# Patient Record
Sex: Male | Born: 1940 | Race: White | Hispanic: No | Marital: Married | Smoking: Never smoker
Health system: Southern US, Community
[De-identification: ages and names within clinical notes are randomized; demographics above are authoritative.]

## PROBLEM LIST (undated history)

## (undated) DIAGNOSIS — H269 Unspecified cataract: Secondary | ICD-10-CM

## (undated) DIAGNOSIS — M1990 Unspecified osteoarthritis, unspecified site: Secondary | ICD-10-CM

## (undated) DIAGNOSIS — I10 Essential (primary) hypertension: Secondary | ICD-10-CM

## (undated) DIAGNOSIS — Z87442 Personal history of urinary calculi: Secondary | ICD-10-CM

## (undated) DIAGNOSIS — Z972 Presence of dental prosthetic device (complete) (partial): Secondary | ICD-10-CM

## (undated) DIAGNOSIS — R519 Headache, unspecified: Secondary | ICD-10-CM

## (undated) DIAGNOSIS — M48061 Spinal stenosis, lumbar region without neurogenic claudication: Secondary | ICD-10-CM

## (undated) DIAGNOSIS — H9190 Unspecified hearing loss, unspecified ear: Secondary | ICD-10-CM

## (undated) DIAGNOSIS — R51 Headache: Secondary | ICD-10-CM

## (undated) HISTORY — PX: ABDOMINAL EXPOSURE: SHX5708

## (undated) HISTORY — PX: MULTIPLE TOOTH EXTRACTIONS: SHX2053

## (undated) HISTORY — PX: COLONOSCOPY: SHX174

## (undated) HISTORY — PX: HERNIA REPAIR: SHX51

## (undated) HISTORY — PX: FRACTURE SURGERY: SHX138

---

## 2016-03-15 ENCOUNTER — Telehealth (INDEPENDENT_AMBULATORY_CARE_PROVIDER_SITE_OTHER): Payer: Self-pay

## 2016-03-15 NOTE — Telephone Encounter (Signed)
If mostly low back without shooting leg and no new trauma since then 2016, then ok for two level facets but will eval and let him if I see him and it seems like it is different or needs more evaluation will talk then ie does not guarentee injection will be done.

## 2016-03-15 NOTE — Telephone Encounter (Signed)
Patient called requesting another injection. Had bilateral L4-5 & L5-S1 facet 11/10/14. Says it helped and pain has returned recently. Just back and no leg pain. Ok to repeat vs ov? Lives in [place].

## 2016-03-16 NOTE — Telephone Encounter (Signed)
Pt scheduled for 03/26/16 @2 :15 w/driver and no BTS

## 2016-03-26 ENCOUNTER — Encounter (INDEPENDENT_AMBULATORY_CARE_PROVIDER_SITE_OTHER): Payer: Self-pay | Admitting: Physical Medicine and Rehabilitation

## 2016-03-26 ENCOUNTER — Ambulatory Visit (INDEPENDENT_AMBULATORY_CARE_PROVIDER_SITE_OTHER): Payer: Self-pay

## 2016-03-26 ENCOUNTER — Ambulatory Visit (INDEPENDENT_AMBULATORY_CARE_PROVIDER_SITE_OTHER): Payer: Medicare Other | Admitting: Physical Medicine and Rehabilitation

## 2016-03-26 VITALS — BP 141/76 | HR 93 | Temp 98.4°F

## 2016-03-26 DIAGNOSIS — M5416 Radiculopathy, lumbar region: Secondary | ICD-10-CM

## 2016-03-26 MED ORDER — LIDOCAINE HCL (PF) 1 % IJ SOLN
0.3300 mL | Freq: Once | INTRAMUSCULAR | Status: AC
  Administered 2016-03-26: 0.3 mL

## 2016-03-26 MED ORDER — METHYLPREDNISOLONE ACETATE 80 MG/ML IJ SUSP
80.0000 mg | Freq: Once | INTRAMUSCULAR | Status: AC
  Administered 2016-03-26: 80 mg

## 2016-03-26 NOTE — Progress Notes (Signed)
Ian Hendricks - 76 y.o. male MRN [id number]  Date of birth: 1940/01/29  Office Visit Note:  Visit Date: 03/26/2016 PCP: No primary care provider on file. Referred by: No ref. provider found  Subjective: Chief Complaint  Patient presents with  . 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 From a clinical standpoint he has both facet mediated low back pain predominantly on the left side but he has some referral into the hip itself feel like is related to the stenosis. When we saw him in the past we completed an epidural injection from a transforaminal approach was very successful with the hip pain. We came back with a facet joint block gave him a lot of relief for his low back pain we really haven't seen him for over a year. He reports increasing similar symptoms once again without any known trauma no numbness or tingling or paresthesia no focal weakness and no red flag symptoms.Pain returned around 3 to 4 months and increased pain for around 1 month. Comes and goes. Says some days no pain at all. "No rhyme or reason".   left l4 tf esi  ROS Otherwise per HPI.  Assessment & Plan: Visit Diagnoses:  1. Lumbar radiculopathy     Plan: Findings:  Left L4 transforaminal epidural steroid injection was performed today due to the likely radicular component of his hip pain. He does have significant severe stenosis at this level. They do travel from [place] and we did bring him in for potential injection. This is done well in the past but this would also be diagnostic because it has been over a year and a half since we've seen him. If he does not get much relief and he will call us and we will try to make appropriate evaluation at that time. I do try to minimize his trips and out from where he lives. Physical exam findings are nonfocal.    Meds & Orders:  Meds ordered this  encounter  Medications  . lidocaine (PF) (XYLOCAINE) 1 % injection 0.3 mL  . methylPREDNISolone acetate (DEPO-MEDROL) injection 80 mg    Orders Placed This Encounter  Procedures  . XR C-ARM NO REPORT  . Epidural Steroid injection    Follow-up: Return if symptoms worsen or fail to improve, 2 weeks, for considering facet blocks.   Procedures: No procedures performed  Lumbosacral Transforaminal Epidural Steroid Injection - Infraneural Approach with Fluoroscopic Guidance  Patient: Ian Hendricks      Date of Birth: [birth year] MRN: [id number] PCP: No primary care provider on file.      Visit Date: 03/26/2016   Universal Protocol:    Date/Time: 03/21/185:59 AM  Consent Given By: the patient  Position: PRONE   Additional Comments: Vital signs were monitored before and after the procedure. Patient was prepped and draped in the usual sterile fashion. The correct patient, procedure, and site was verified.   Injection Procedure Details:  Procedure Site One Meds Administered:  Meds ordered this encounter  Medications  . lidocaine (PF) (XYLOCAINE) 1 % injection 0.3 mL  . methylPREDNISolone acetate (DEPO-MEDROL) injection 80 mg      Laterality: Left  Location/Site:  L4-L5  Needle size: 22 G  Needle type: Spinal  Needle Placement: Transforaminal  Findings:  -Contrast Used: 2 mL iohexol 180 mg iodine/mL   -Comments: Excellent flow of contrast along the nerve and into the epidural space.  Procedure Details: After squaring off the end-plates of the desired vertebral level to get a true AP view, the C-arm was obliqued to the painful side so that the superior articulating process is positioned about 1/3 the length of the inferior endplate.  The needle was aimed toward the junction of the superior articular process and the transverse process of the inferior vertebrae. The needle's initial entry is in the lower third of the foramen through Kambin's triangle. The soft tissues  overlying this target were infiltrated with 2-3 ml. of 1% Lidocaine without Epinephrine.  The spinal needle was then inserted and advanced toward the target using a "trajectory" view along the fluoroscope beam.  Under AP and lateral visualization, the needle was advanced so it did not puncture dura and did not traverse medially beyond the 6 o'clock position of the pedicle. Bi-planar projections were used to confirm position. Aspiration was confirmed to be negative for CSF and/or blood. A 1-2 ml. volume of Isovue-250 was injected and flow of contrast was noted at each level. Radiographs were obtained for documentation purposes.   After attaining the desired flow of contrast documented above, a 0.5 to 1.0 ml test dose of 0.25% Marcaine was injected into each respective transforaminal space.  The patient was observed for 90 seconds post injection.  After no sensory deficits were reported, and normal lower extremity motor function was noted,   the above injectate was administered so that equal amounts of the injectate were placed at each foramen (level) into the transforaminal epidural space.   Additional Comments:  The patient tolerated the procedure well Dressing: Band-Aid    Post-procedure details: Patient was observed during the procedure. Post-procedure instructions were reviewed.  Patient left the clinic in stable condition.   Clinical History: No specialty comments available.  He reports that he has never smoked. He has never used smokeless tobacco. No results for input(s): HGBA1C, LABURIC in the last 8760 hours.  Objective:  VS:  HT:    WT:   BMI:     BP:(!) 141/76  HR:93bpm  TEMP:98.4 F (36.9 C)( )  RESP:94 % Physical Exam  Musculoskeletal:  Patient is slow to rise from a seated position with concordant pain with extension rotation to the left more than right. He has no pain over the greater trochanters no pain with hip rotation. He has good distal strength without clonus.    Neurological: He exhibits normal muscle tone. Coordination normal.    Ortho Exam Imaging: No results found.  Past Medical/Family/Surgical/Social History: Medications & Allergies reviewed per EMR There are no active problems to display for this patient.  History reviewed. No pertinent past medical history. History reviewed. No pertinent family history. History reviewed. No pertinent surgical history. Social History   Occupational History  . Not on file.   Social History Main Topics  . Smoking status: Never Smoker  . Smokeless tobacco: Never Used  . Alcohol use No  . Drug use: No  . Sexual activity: Not on file

## 2016-03-26 NOTE — Patient Instructions (Signed)

## 2016-03-28 NOTE — Procedures (Signed)
Lumbosacral Transforaminal Epidural Steroid Injection - Infraneural Approach with Fluoroscopic Guidance  Patient: Ian Hendricks      Date of Birth: August 21, 1940 MRN: [id number] PCP: No primary care provider on file.      Visit Date: 03/26/2016   Universal Protocol:    Date/Time: 03/21/185:59 AM  Consent Given By: the patient  Position: PRONE   Additional Comments: Vital signs were monitored before and after the procedure. Patient was prepped and draped in the usual sterile fashion. The correct patient, procedure, and site was verified.   Injection Procedure Details:  Procedure Site One Meds Administered:  Meds ordered this encounter  Medications  . lidocaine (PF) (XYLOCAINE) 1 % injection 0.3 mL  . methylPREDNISolone acetate (DEPO-MEDROL) injection 80 mg      Laterality: Left  Location/Site:  L4-L5  Needle size: 22 G  Needle type: Spinal  Needle Placement: Transforaminal  Findings:  -Contrast Used: 2 mL iohexol 180 mg iodine/mL   -Comments: Excellent flow of contrast along the nerve and into the epidural space.  Procedure Details: After squaring off the end-plates of the desired vertebral level to get a true AP view, the C-arm was obliqued to the painful side so that the superior articulating process is positioned about 1/3 the length of the inferior endplate.  The needle was aimed toward the junction of the superior articular process and the transverse process of the inferior vertebrae. The needle's initial entry is in the lower third of the foramen through Kambin's triangle. The soft tissues overlying this target were infiltrated with 2-3 ml. of 1% Lidocaine without Epinephrine.  The spinal needle was then inserted and advanced toward the target using a "trajectory" view along the fluoroscope beam.  Under AP and lateral visualization, the needle was advanced so it did not puncture dura and did not traverse medially beyond the 6 o'clock position of the pedicle.  Bi-planar projections were used to confirm position. Aspiration was confirmed to be negative for CSF and/or blood. A 1-2 ml. volume of Isovue-250 was injected and flow of contrast was noted at each level. Radiographs were obtained for documentation purposes.   After attaining the desired flow of contrast documented above, a 0.5 to 1.0 ml test dose of 0.25% Marcaine was injected into each respective transforaminal space.  The patient was observed for 90 seconds post injection.  After no sensory deficits were reported, and normal lower extremity motor function was noted,   the above injectate was administered so that equal amounts of the injectate were placed at each foramen (level) into the transforaminal epidural space.   Additional Comments:  The patient tolerated the procedure well Dressing: Band-Aid    Post-procedure details: Patient was observed during the procedure. Post-procedure instructions were reviewed.  Patient left the clinic in stable condition.

## 2016-06-13 ENCOUNTER — Telehealth (INDEPENDENT_AMBULATORY_CARE_PROVIDER_SITE_OTHER): Payer: Self-pay | Admitting: Physical Medicine and Rehabilitation

## 2016-06-13 NOTE — Telephone Encounter (Signed)
In the past we did TF esi followed by facet joint block. If he is having mostly back pain then would facet block, see SRS

## 2016-06-13 NOTE — Telephone Encounter (Signed)
Depending on insurance we can play by ear, if needed precert then ok to repeat last

## 2016-06-13 NOTE — Telephone Encounter (Signed)
Called patient and left message for him to call back to discuss/ schedule. 

## 2016-06-13 NOTE — Telephone Encounter (Signed)
Patient said it is in his lower back, but sometimes it does hurt/ ache in his left hip and leg. He said March inj did help. Repeat?

## 2016-06-14 NOTE — Telephone Encounter (Signed)
Scheduled for 6/18 at 1500 with driver. Medicare and [employer].

## 2016-06-25 ENCOUNTER — Ambulatory Visit (INDEPENDENT_AMBULATORY_CARE_PROVIDER_SITE_OTHER): Payer: Medicare Other

## 2016-06-25 ENCOUNTER — Ambulatory Visit (INDEPENDENT_AMBULATORY_CARE_PROVIDER_SITE_OTHER): Payer: Medicare Other | Admitting: Physical Medicine and Rehabilitation

## 2016-06-25 VITALS — BP 128/70 | HR 58

## 2016-06-25 DIAGNOSIS — M5416 Radiculopathy, lumbar region: Secondary | ICD-10-CM

## 2016-06-25 DIAGNOSIS — M48062 Spinal stenosis, lumbar region with neurogenic claudication: Secondary | ICD-10-CM | POA: Diagnosis not present

## 2016-06-25 MED ORDER — METHYLPREDNISOLONE ACETATE 80 MG/ML IJ SUSP
80.0000 mg | Freq: Once | INTRAMUSCULAR | Status: AC
  Administered 2016-06-25: 80 mg

## 2016-06-25 MED ORDER — LIDOCAINE HCL (PF) 1 % IJ SOLN
2.0000 mL | Freq: Once | INTRAMUSCULAR | Status: AC
  Administered 2016-06-25: 2 mL

## 2016-06-25 NOTE — Progress Notes (Deleted)
Pain across low back. Right=left. Radiates down left leg to calf at times. No numbness or tingling.

## 2016-06-25 NOTE — Patient Instructions (Signed)

## 2016-06-26 ENCOUNTER — Encounter (INDEPENDENT_AMBULATORY_CARE_PROVIDER_SITE_OTHER): Payer: Self-pay | Admitting: Physical Medicine and Rehabilitation

## 2016-06-26 NOTE — Procedures (Signed)
Lumbosacral Transforaminal Epidural Steroid Injection - Infraneural Approach with Fluoroscopic Guidance  Patient: Ian Hendricks      Date of Birth: 1940/12/11 MRN: [id number] PCP: No primary care provider on file.      Visit Date: 06/25/2016   Universal Protocol:     Consent Given By: the patient  Position: PRONE   Additional Comments: Vital signs were monitored before and after the procedure. Patient was prepped and draped in the usual sterile fashion. The correct patient, procedure, and site was verified.   Injection Procedure Details:  Procedure Site One Meds Administered:  Meds ordered this encounter  Medications  . lidocaine (PF) (XYLOCAINE) 1 % injection 2 mL  . methylPREDNISolone acetate (DEPO-MEDROL) injection 80 mg      Laterality: Bilateral  Location/Site:  L4-L5  Needle size: 22 G  Needle type: Spinal  Needle Placement: Transforaminal  Findings:  -Contrast Used: 1 mL iohexol 180 mg iodine/mL   -Comments: Excellent flow of contrast along the nerve and into the epidural space.  Procedure Details: After squaring off the end-plates of the desired vertebral level to get a true AP view, the C-arm was obliqued to the painful side so that the superior articulating process is positioned about 1/3 the length of the inferior endplate.  The needle was aimed toward the junction of the superior articular process and the transverse process of the inferior vertebrae. The needle's initial entry is in the lower third of the foramen through Kambin's triangle. The soft tissues overlying this target were infiltrated with 2-3 ml. of 1% Lidocaine without Epinephrine.  The spinal needle was then inserted and advanced toward the target using a "trajectory" view along the fluoroscope beam.  Under AP and lateral visualization, the needle was advanced so it did not puncture dura and did not traverse medially beyond the 6 o'clock position of the pedicle. Bi-planar projections were used  to confirm position. Aspiration was confirmed to be negative for CSF and/or blood. A 1-2 ml. volume of Isovue-250 was injected and flow of contrast was noted at each level. Radiographs were obtained for documentation purposes.   After attaining the desired flow of contrast documented above, a 0.5 to 1.0 ml test dose of 0.25% Marcaine was injected into each respective transforaminal space.  The patient was observed for 90 seconds post injection.  After no sensory deficits were reported, and normal lower extremity motor function was noted,   the above injectate was administered so that equal amounts of the injectate were placed at each foramen (level) into the transforaminal epidural space.   Additional Comments:  The patient tolerated the procedure well No complications occurred Dressing: Band-Aid    Post-procedure details: Patient was observed during the procedure. Post-procedure instructions were reviewed.  Patient left the clinic in stable condition.

## 2016-06-26 NOTE — Progress Notes (Signed)
Ian Hendricks - 76 y.o. male MRN [id number]  Date of birth: Jan 04, 1941  Office Visit Note: Visit Date: 06/25/2016 PCP: No primary care provider on file. Referred by: No ref. provider found  Subjective: Chief Complaint  Patient presents with  . Lower Back - Pain   HPI: Ian Hendricks is a 76 year old gentleman that we seen on a few occasions. When I first saw him we completed transforaminal epidural steroid injection for good relief of his radicular pain and claudication. He went on for quite some time and we ended up repeating the injection and he did well again. At that point we did complete facet joint blocks and he got some relief of his back pain at that point and was doing well. The last time I saw him was in March of this year and he had similar complaints except more left-sided hip and leg pain versus bilateral leg pain. He has had an MRI from 2016 this is reviewed again below. There is moderate to severe multifactorial stenosis at L4-5. He has multilevel facet arthropathy. He is still an active individual and 75. He does live in [place]. He's had no focal weakness but does have difficulty walking at times. He is better at rest. He's had no trauma. No prior lumbar surgery. He has had therapy and chiropractic care in the remote past but not recently. He does not take a lot of medications. He is not on any blood thinners. He reports last injection which was in L4 transforaminal injection seemed to help quite a bit but the symptoms returned pretty quickly. He does get more symptoms on the left hip and leg more than right    Review of Systems  Constitutional: Negative for chills, fever, malaise/fatigue and weight loss.  HENT: Negative for hearing loss and sinus pain.   Eyes: Negative for blurred vision, double vision and photophobia.  Respiratory: Negative for cough and shortness of breath.   Cardiovascular: Negative for chest pain, palpitations and leg swelling.    Gastrointestinal: Negative for abdominal pain, nausea and vomiting.  Genitourinary: Negative for flank pain.  Musculoskeletal: Positive for back pain. Negative for myalgias.       Bilateral hip and leg pain  Skin: Negative for itching and rash.  Neurological: Negative for tremors, focal weakness and weakness.  Endo/Heme/Allergies: Negative.   Psychiatric/Behavioral: Negative for depression.  All other systems reviewed and are negative.  Otherwise per HPI.  Assessment & Plan: Visit Diagnoses:  1. 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 He also has left radicular leg pain with standing and ambulating. Prior injection at L4 gave him quite a bit relief on the left side but his symptoms returned. It did seem to help the back pain as well. In the past she's had facet joint blocks which were somewhat beneficial but those were sometime ago. MRI evidence of significant stenosis at L4-5. He may do well with simple decompression laminectomy. We did talk at length about surgery and the typical outcomes for surgery and what can be done. We are going to get a referral and appointment to Dr. Otelia SergeantNitka for evaluation. We are going to repeat the L4 transforaminal injection today but do this bilaterally. Hopefully we'll get a lot of relief from that as well. He has no red flag symptoms on exam or clinical history.  He is felt conservative care otherwise. He still an active 76 year old individual. I spent more than 25 minutes speaking face-to-face with the patient with 50% of the time in counseling.    Meds & Orders:  Meds ordered this encounter  Medications  . lidocaine (PF) (XYLOCAINE) 1 % injection 2 mL  . methylPREDNISolone acetate (DEPO-MEDROL) injection 80 mg    Orders Placed This Encounter  Procedures  . XR C-ARM NO  REPORT  . Epidural Steroid injection    Follow-up: Return if symptoms worsen or fail to improve, for referral to Dr. Otelia Sergeant.   Procedures: No procedures performed  Lumbosacral Transforaminal Epidural Steroid Injection - Infraneural Approach with Fluoroscopic Guidance  Patient: Ian Hendricks      Date of Birth: [birth year] MRN: [id number] PCP: No primary care provider on file.      Visit Date: 06/25/2016   Universal Protocol:     Consent Given By: the patient  Position: PRONE   Additional Comments: Vital signs were monitored before and after the procedure. Patient was prepped and draped in the usual sterile fashion. The correct patient, procedure, and site was verified.   Injection Procedure Details:  Procedure Site One Meds Administered:  Meds ordered this encounter  Medications  . lidocaine (PF) (XYLOCAINE) 1 % injection 2 mL  . methylPREDNISolone acetate (DEPO-MEDROL) injection 80 mg      Laterality: Bilateral  Location/Site:  L4-L5  Needle size: 22 G  Needle type: Spinal  Needle Placement: Transforaminal  Findings:  -Contrast Used: 1 mL iohexol 180 mg iodine/mL   -Comments: Excellent flow of contrast along the nerve and into the epidural space.  Procedure Details: After squaring off the end-plates of the desired vertebral level to get a true AP view, the C-arm was obliqued to the painful side so that the superior articulating process is positioned about 1/3 the length of the inferior endplate.  The needle was aimed toward the junction of the superior articular process and the transverse process of the inferior vertebrae. The needle's initial entry is in the lower third of the foramen through Kambin's triangle. The soft tissues overlying this target were infiltrated with 2-3 ml. of 1% Lidocaine without Epinephrine.  The spinal needle was then inserted and advanced toward the target using a "trajectory" view along the fluoroscope beam.  Under AP and lateral  visualization, the needle was advanced so it did not puncture dura and did not traverse medially beyond the 6 o'clock position of the pedicle. Bi-planar projections were used to confirm position. Aspiration was confirmed to be negative for CSF and/or blood. A 1-2 ml. volume of Isovue-250 was injected and flow of contrast was noted at each level. Radiographs were obtained for documentation purposes.   After attaining the desired flow of contrast documented above, a 0.5 to 1.0 ml test dose of 0.25% Marcaine was injected into each respective transforaminal space.  The patient was observed for 90 seconds post injection.  After no sensory deficits were reported, and normal lower extremity motor function was noted,   the above injectate was administered so that equal amounts of the injectate were placed at each foramen (level) into the transforaminal epidural space.   Additional Comments:  The patient tolerated the procedure well No complications occurred Dressing: Band-Aid    Post-procedure details: Patient was observed during the procedure. Post-procedure instructions were reviewed.  Patient left the clinic in stable condition.   Clinical History: MRI lumbar spine dated 10/05/2014 Moderate facet arthropathy at L3-4 with right L3 foraminal  disc protrusion. Moderate to severe facet arthropathy at L4-5 with moderate to severe stenosis centrally and lateral recesses. L5-S1 facet moderate arthritis.  He reports that he has never smoked. He has never used smokeless tobacco. No results for input(s): HGBA1C, LABURIC in the last 8760 hours.  Objective:  VS:  HT:    WT:   BMI:     BP:128/70  HR:(!) 58bpm  TEMP: ( )  RESP:95 % Physical Exam  Constitutional: He is oriented to person, place, and time. He appears well-developed and well-nourished. No distress.  HENT:  Head: Normocephalic and atraumatic.  Eyes: Conjunctivae are normal. Pupils are equal, round, and reactive to light.  Neck: Normal range  of motion. Neck supple.  Cardiovascular: Regular rhythm and intact distal pulses.   Pulmonary/Chest: Effort normal. No respiratory distress.  Musculoskeletal:  Patient ambulates without aid with a forward flexed spine. He has pain with extension of the lumbar spine. No pain over the greater trochanters. No pain with hip rotation. Good distal strength. No clonus bilaterally.  Neurological: He is alert and oriented to person, place, and time. He exhibits normal muscle tone. Coordination normal.  Skin: Skin is warm and dry. No rash noted. No erythema.  Psychiatric: He has a normal mood and affect.  Nursing note and vitals reviewed.   Ortho Exam Imaging: Xr C-arm No Report  Result Date: 06/25/2016 Please see Notes or Procedures tab for imaging impression.   Past Medical/Family/Surgical/Social History: Medications & Allergies reviewed per EMR There are no active problems to display for this patient.  History reviewed. No pertinent past medical history. History reviewed. No pertinent family history. History reviewed. No pertinent surgical history. Social History   Occupational History  . Not on file.   Social History Main Topics  . Smoking status: Never Smoker  . Smokeless tobacco: Never Used  . Alcohol use No  . Drug use: No  . Sexual activity: Not on file

## 2016-08-09 ENCOUNTER — Ambulatory Visit (INDEPENDENT_AMBULATORY_CARE_PROVIDER_SITE_OTHER): Payer: Medicare Other

## 2016-08-09 ENCOUNTER — Encounter (INDEPENDENT_AMBULATORY_CARE_PROVIDER_SITE_OTHER): Payer: Self-pay | Admitting: Specialist

## 2016-08-09 ENCOUNTER — Ambulatory Visit (INDEPENDENT_AMBULATORY_CARE_PROVIDER_SITE_OTHER): Payer: Medicare Other | Admitting: Specialist

## 2016-08-09 DIAGNOSIS — M5416 Radiculopathy, lumbar region: Secondary | ICD-10-CM

## 2016-08-09 DIAGNOSIS — M48062 Spinal stenosis, lumbar region with neurogenic claudication: Secondary | ICD-10-CM | POA: Diagnosis not present

## 2016-08-09 DIAGNOSIS — G9519 Other vascular myelopathies: Secondary | ICD-10-CM

## 2016-08-09 NOTE — Progress Notes (Signed)
Office Visit Note   Patient: Ian Hendricks           Date of Birth: 10/14/1940           MRN: [id number] Visit Date: 08/09/2016              Requested by: No referring provider defined for this encounter. PCP: System, Pcp Not In   Assessment & Plan: Visit Diagnoses:  1. Radiculopathy, lumbar region   2. Neurogenic claudication     Plan: Due to patient's ongoing symptoms that failed conservative treatment we'll schedule lumbar spine MRI to rule out HNP/stenosis. Compare to previous study done 2016. Follow-up the office with Dr.Nitka after to discuss results and further treatment options.  Follow-Up Instructions: No Follow-up on file.   Orders:  Orders Placed This Encounter  Procedures  . XR Lumbar Spine 2-3 Views  . MR LUMBAR SPINE WO CONTRAST   No orders of the defined types were placed in this encounter.     Procedures: No procedures performed   Clinical Data: No additional findings.   Subjective: Chief Complaint  Patient presents with  . Lower Back - Pain    HPI 76 year old white male is being seen at request of Dr. Alvester MorinNewton for ongoing issues with lumbar spine. The last several years patient having complaints of low back pain with radiation into his left buttock and thigh. Nothing radiating below the knee. Occasional numbness and tingling into the left thighs well. No symptoms on the right side. Patient states that symptoms are worse with standing and ambulation. States that when he is in the store he does have to hold onto a grocery cart leaning forward due to the increased symptoms in his low back and left upper leg. He's had ESI with in the past with temporary improvement. Most recent injections were March in June 2018 left L4-5 transforaminal. MRI scan lumbar spine 10/05/2014 report read loss spinal stenosis at L1-2 and L2-3. Mild spinal stenosis T11-12. Moderate spinal stenosis L3-4 with superimposed right-sided disc protrusion, expected impingement of the  right L4 nerve root. Moderate to severe spinal stenosis at L5-S1.  Review of Systems  no current cardiac pulmonary GI GU issues.   Objective: Vital Signs: There were no vitals taken for this visit.  Physical Exam  Constitutional: He is oriented to person, place, and time. He appears well-developed. No distress.  HENT:  Head: Normocephalic and atraumatic.  Eyes: Pupils are equal, round, and reactive to light. EOM are normal.  Neck: Normal range of motion.  Pulmonary/Chest: No respiratory distress.  Abdominal: He exhibits no distension.  Musculoskeletal:  Positive left lumbar paraspinal tenderness/spasms. Sciatic notch nontender. Nontender over the bilateral hip greater trochanters. Negative logroll bilateral hips. Negative straight leg raise. Bilateral calves nontender. No focal motor deficits. Neurovascularly intact.  Neurological: He is alert and oriented to person, place, and time.  Skin: Skin is warm and dry.    Ortho Exam  Specialty Comments:  No specialty comments available.  Imaging: No results found.   PMFS History: There are no active problems to display for this patient.  No past medical history on file.  No family history on file.  No past surgical history on file. Social History   Occupational History  . Not on file.   Social History Main Topics  . Smoking status: Never Smoker  . Smokeless tobacco: Never Used  . Alcohol use No  . Drug use: No  . Sexual activity: Not on file

## 2016-08-15 ENCOUNTER — Telehealth (INDEPENDENT_AMBULATORY_CARE_PROVIDER_SITE_OTHER): Payer: Self-pay | Admitting: Specialist

## 2016-08-15 NOTE — Telephone Encounter (Signed)
Pt is scheduled and is aware of appt

## 2016-08-15 NOTE — Telephone Encounter (Signed)
Patient called asking to set up [place] at the [place]. CB # [phone]

## 2016-08-30 ENCOUNTER — Encounter (INDEPENDENT_AMBULATORY_CARE_PROVIDER_SITE_OTHER): Payer: Self-pay | Admitting: Specialist

## 2016-08-30 ENCOUNTER — Ambulatory Visit (INDEPENDENT_AMBULATORY_CARE_PROVIDER_SITE_OTHER): Payer: Medicare Other | Admitting: Specialist

## 2016-08-30 VITALS — BP 166/80 | HR 80 | Temp 97.5°F | Ht 69.0 in | Wt 180.0 lb

## 2016-08-30 DIAGNOSIS — M48062 Spinal stenosis, lumbar region with neurogenic claudication: Secondary | ICD-10-CM

## 2016-08-30 NOTE — Progress Notes (Addendum)
Office Visit Note   Patient: Ian Hendricks           Date of Birth: 1940-07-22           MRN: [id number] Visit Date: 08/30/2016              Requested by: No referring provider defined for this encounter. PCP: System, Pcp Not In   Assessment & Plan: Visit Diagnoses:  1. Spinal stenosis of lumbar region with neurogenic claudication     Plan: Avoid bending, stooping and avoid lifting weights greater than 10 lbs. Avoid prolong standing and walking. Surgery scheduling secretary Tivis Ringer, will call you in the next week to schedule for surgery.  Surgery recommended is a three level lumbar bilateral partial hemilaminectomies for spinal stenosis L3-4, L4-5 and L5-S1  Take hydrocodone for for pain. Risk of surgery includes risk of infection 1 in 200 patients, bleeding 1/2% chance you would need a transfusion.   Risk to the nerves is one in 10,000. You will need to use a corset half days for 2-3 weeks to allow for lumbar support. Expect improved walking and standing tolerance. Expect relief of leg pain but numbness may persist depending on the length and degree of pressure that has been present.  Follow-Up Instructions: Return in about 4 weeks (around 09/27/2016).   Orders:  No orders of the defined types were placed in this encounter.  No orders of the defined types were placed in this encounter.     Procedures: No procedures performed   Clinical Data: No additional findings.   Subjective: Chief Complaint  Patient presents with  . Lower Back - Follow-up    76 year old male with several month history of left leg pain with numbness and weakness. Pain is improved with sitting and flexion of the left hip. Pain is worse with standing and walking any distance. He is relieved with sitting for a short period and then can walk again. No bowel or bladder difficulty. He is leaning on carts at the grocery store and avoiding going to a grocery store at all due to pain in his  buttocks and thigh the longer he is standing and walking. No pain with coughing. No difficuly reaching his shoes or socks. Esis have been done with      Review of Systems  Constitutional: Negative.   HENT: Negative.   Eyes: Negative.   Respiratory: Negative.   Cardiovascular: Negative.   Gastrointestinal: Negative.   Endocrine: Negative.   Genitourinary: Negative.   Musculoskeletal: Negative.   Skin: Negative.   Allergic/Immunologic: Negative.   Neurological: Negative.   Hematological: Negative.   Psychiatric/Behavioral: Negative.      Objective: Vital Signs: BP (!) 166/80 (BP Location: Left Arm, Patient Position: Sitting, Cuff Size: Normal)   Pulse 80   Temp (!) 97.5 F (36.4 C) (Oral)   Ht 5\' 9"  (1.753 m)   Wt 180 lb (81.6 kg)   BMI 26.58 kg/m   Physical Exam  Constitutional: He is oriented to person, place, and time. He appears well-developed and well-nourished.  HENT:  Head: Normocephalic and atraumatic.  Eyes: Pupils are equal, round, and reactive to light. EOM are normal.  Neck: Normal range of motion. Neck supple.  Pulmonary/Chest: Effort normal and breath sounds normal.  Abdominal: Soft. Bowel sounds are normal.  Neurological: He is alert and oriented to person, place, and time.  Skin: Skin is warm and dry.  Psychiatric: He has a normal mood and affect. His behavior is  normal. Judgment and thought content normal.    Back Exam   Tenderness  The patient is experiencing tenderness in the lumbar.  Range of Motion  Extension: abnormal  Flexion: normal  Lateral Bend Right: abnormal  Lateral Bend Left: abnormal  Rotation Right: abnormal  Rotation Left: abnormal   Muscle Strength  Right Quadriceps:  5/5  Left Quadriceps:  5/5  Right Hamstrings:  5/5  Left Hamstrings:  5/5   Tests  Straight leg raise right: negative Straight leg raise left: negative  Reflexes  Patellar: Hyperreflexic Achilles: normal Babinski's sign: normal   Other  Toe Walk:  normal Heel Walk: normal      Specialty Comments:  No specialty comments available.  Imaging: No results found.   PMFS History: There are no active problems to display for this patient.  History reviewed. No pertinent past medical history.  History reviewed. No pertinent family history.  History reviewed. No pertinent surgical history. Social History   Occupational History  . Not on file.   Social History Main Topics  . Smoking status: Never Smoker  . Smokeless tobacco: Never Used  . Alcohol use No  . Drug use: No  . Sexual activity: Not on file

## 2016-08-30 NOTE — Patient Instructions (Signed)
Avoid bending, stooping and avoid lifting weights greater than 10 lbs. Avoid prolong standing and walking. Surgery scheduling secretary Tivis Ringer, will call you in the next week to schedule for surgery.  Surgery recommended is a three level lumbar bilateral partial hemilaminectomies for spinal stenosis L3-4, L4-5 and L5-S1  Take hydrocodone for for pain. Risk of surgery includes risk of infection 1 in 200 patients, bleeding 1/2% chance you would need a transfusion.   Risk to the nerves is one in 10,000. You will need to use a corset half days for 2-3 weeks to allow for lumbar support. Expect improved walking and standing tolerance. Expect relief of leg pain but numbness may persist depending on the length and degree of pressure that has been present.

## 2016-10-15 ENCOUNTER — Telehealth (INDEPENDENT_AMBULATORY_CARE_PROVIDER_SITE_OTHER): Payer: Self-pay

## 2016-10-15 NOTE — Telephone Encounter (Signed)
Patient drove down from [place], [place] to see about scheduling surgery.  I never received an order to schedule.  I advised patient I would obtain order from Dr. Otelia Sergeant and we would get patient scheduled as soon as possible.

## 2016-10-25 ENCOUNTER — Encounter (HOSPITAL_COMMUNITY): Payer: Self-pay

## 2016-10-25 ENCOUNTER — Encounter (HOSPITAL_COMMUNITY)
Admission: RE | Admit: 2016-10-25 | Discharge: 2016-10-25 | Disposition: A | Discharge status: Home / Self Care | Payer: Medicare Other | Source: Ambulatory Visit | Attending: Specialist | Admitting: Specialist

## 2016-10-25 ENCOUNTER — Ambulatory Visit (HOSPITAL_COMMUNITY)
Admission: RE | Admit: 2016-10-25 | Discharge: 2016-10-25 | Disposition: A | Discharge status: Home / Self Care | Payer: Medicare Other | Source: Ambulatory Visit | Attending: Surgery | Admitting: Surgery

## 2016-10-25 DIAGNOSIS — I700 Atherosclerosis of aorta: Secondary | ICD-10-CM | POA: Insufficient documentation

## 2016-10-25 DIAGNOSIS — Z7982 Long term (current) use of aspirin: Secondary | ICD-10-CM | POA: Insufficient documentation

## 2016-10-25 DIAGNOSIS — Z79899 Other long term (current) drug therapy: Secondary | ICD-10-CM | POA: Diagnosis not present

## 2016-10-25 DIAGNOSIS — R001 Bradycardia, unspecified: Secondary | ICD-10-CM | POA: Insufficient documentation

## 2016-10-25 DIAGNOSIS — M48061 Spinal stenosis, lumbar region without neurogenic claudication: Secondary | ICD-10-CM | POA: Diagnosis not present

## 2016-10-25 DIAGNOSIS — I440 Atrioventricular block, first degree: Secondary | ICD-10-CM | POA: Insufficient documentation

## 2016-10-25 DIAGNOSIS — Z01810 Encounter for preprocedural cardiovascular examination: Secondary | ICD-10-CM | POA: Insufficient documentation

## 2016-10-25 DIAGNOSIS — Z01818 Encounter for other preprocedural examination: Secondary | ICD-10-CM | POA: Insufficient documentation

## 2016-10-25 DIAGNOSIS — J984 Other disorders of lung: Secondary | ICD-10-CM | POA: Diagnosis not present

## 2016-10-25 HISTORY — DX: Spinal stenosis, lumbar region without neurogenic claudication: M48.061

## 2016-10-25 HISTORY — DX: Unspecified hearing loss, unspecified ear: H91.90

## 2016-10-25 HISTORY — DX: Unspecified cataract: H26.9

## 2016-10-25 HISTORY — DX: Headache: R51

## 2016-10-25 HISTORY — DX: Personal history of urinary calculi: Z87.442

## 2016-10-25 HISTORY — DX: Presence of dental prosthetic device (complete) (partial): Z97.2

## 2016-10-25 HISTORY — DX: Unspecified osteoarthritis, unspecified site: M19.90

## 2016-10-25 HISTORY — DX: Headache, unspecified: R51.9

## 2016-10-25 HISTORY — DX: Essential (primary) hypertension: I10

## 2016-10-25 LAB — CBC
HCT: 48.8 % (ref 39.0–52.0)
Hemoglobin: 16.2 g/dL (ref 13.0–17.0)
MCH: 30.1 pg (ref 26.0–34.0)
MCHC: 33.2 g/dL (ref 30.0–36.0)
MCV: 90.7 fL (ref 78.0–100.0)
PLATELETS: 189 10*3/uL (ref 150–400)
RBC: 5.38 MIL/uL (ref 4.22–5.81)
RDW: 12.9 % (ref 11.5–15.5)
WBC: 7.4 10*3/uL (ref 4.0–10.5)

## 2016-10-25 LAB — COMPREHENSIVE METABOLIC PANEL
ALK PHOS: 48 U/L (ref 38–126)
ALT: 20 U/L (ref 17–63)
AST: 24 U/L (ref 15–41)
Albumin: 4.4 g/dL (ref 3.5–5.0)
Anion gap: 10 (ref 5–15)
BILIRUBIN TOTAL: 1.3 mg/dL — AB (ref 0.3–1.2)
BUN: 12 mg/dL (ref 6–20)
CALCIUM: 9.6 mg/dL (ref 8.9–10.3)
CHLORIDE: 101 mmol/L (ref 101–111)
CO2: 27 mmol/L (ref 22–32)
CREATININE: 0.93 mg/dL (ref 0.61–1.24)
Glucose, Bld: 127 mg/dL — ABNORMAL HIGH (ref 65–99)
Potassium: 3.5 mmol/L (ref 3.5–5.1)
Sodium: 138 mmol/L (ref 135–145)
TOTAL PROTEIN: 7.5 g/dL (ref 6.5–8.1)

## 2016-10-25 LAB — SURGICAL PCR SCREEN
MRSA, PCR: NEGATIVE
Staphylococcus aureus: POSITIVE — AB

## 2016-10-25 LAB — PROTIME-INR
INR: 1
PROTHROMBIN TIME: 13.1 s (ref 11.4–15.2)

## 2016-10-25 LAB — APTT: aPTT: 27 seconds (ref 24–36)

## 2016-10-25 NOTE — Progress Notes (Signed)
Pt denies SOB, chest pain, and being under the care of a cardiologist. Pt denies having an echo and cardiac cath but stated that a stress test was performed > 10 years ago. Pt denies having a chest x ray and EKG within the last year. Pt denies recent labs. Spoke with Sherrie, Surgical Coordinator, to make MD aware that UA needs to be recollected on DOS. Anesthesia asked to review EKG and chest x ray.

## 2016-10-25 NOTE — Pre-Procedure Instructions (Signed)
    Suan HalterDouglas E Heindel  10/25/2016      [facility] - [place], [place] - [address] [place] [id number] Phone: [phone] Fax: [phone]    Your procedure is scheduled on Tuesday, October 30, 2016  Report to [facility] Ad[facility] at 5:30 A.M.  Call this number if you have problems the morning of surgery:  [phone]   Remember:  Do not eat food or drink liquids after midnight Monday, October 29, 2016  Take these medicines the morning of surgery with A SIP OF WATER :amLODipine (NORVASC),  bisoprolol-hydrochlorothiazide [facility](ZIAC)  Stop taking Aspirin, all vitamins, fish oil, Prostate with Palmetto and herbal medications. Do not take any NSAIDs ie: Ibuprofen, Advil, Naproxen Aleve), Motrin, Meloxicam (MOBIC), BC and Goody Powder  or any medication containing Aspirin; stop now.  Do not wear jewelry, make-up or nail polish.  Do not wear lotions, powders, or perfumes, or deoderant.  Do not shave 48 hours prior to surgery.  Men may shave face and neck.  Do not bring valuables to the hospital.  [facility] is not responsible for any belongings or valuables.  Contacts, dentures or bridgework may not be worn into surgery.  Leave your suitcase in the car.  After surgery it may be brought to your room. For patients admitted to the hospital, discharge time will be determined by your treatment team. Patients discharged the day of surgery will not be allowed to drive home.  Special instructions: Shower the night before surgery and the morning of surgery with CHG. Please read over the following fact sheets that you were given. Pain Booklet, Coughing and Deep Breathing, MRSA Information and Surgical Site Infection Prevention

## 2016-10-30 ENCOUNTER — Inpatient Hospital Stay (HOSPITAL_COMMUNITY)
Admission: RE | Admit: 2016-10-30 | Discharge: 2016-11-01 | DRG: 517 | Disposition: A | Discharge status: Home Health | Payer: Medicare Other | Source: Ambulatory Visit | Attending: Specialist | Admitting: Specialist

## 2016-10-30 ENCOUNTER — Inpatient Hospital Stay (HOSPITAL_COMMUNITY): Payer: Medicare Other

## 2016-10-30 ENCOUNTER — Encounter (HOSPITAL_COMMUNITY)
Admission: RE | Disposition: A | Discharge status: Home Health | Payer: Self-pay | Source: Ambulatory Visit | Attending: Specialist

## 2016-10-30 ENCOUNTER — Inpatient Hospital Stay (HOSPITAL_COMMUNITY): Payer: Medicare Other | Admitting: Emergency Medicine

## 2016-10-30 ENCOUNTER — Encounter (HOSPITAL_COMMUNITY): Payer: Self-pay

## 2016-10-30 ENCOUNTER — Inpatient Hospital Stay (HOSPITAL_COMMUNITY): Payer: Medicare Other | Admitting: Certified Registered Nurse Anesthetist

## 2016-10-30 DIAGNOSIS — M48062 Spinal stenosis, lumbar region with neurogenic claudication: Secondary | ICD-10-CM | POA: Diagnosis present

## 2016-10-30 DIAGNOSIS — H9190 Unspecified hearing loss, unspecified ear: Secondary | ICD-10-CM | POA: Diagnosis present

## 2016-10-30 DIAGNOSIS — I10 Essential (primary) hypertension: Secondary | ICD-10-CM | POA: Diagnosis present

## 2016-10-30 DIAGNOSIS — Z7982 Long term (current) use of aspirin: Secondary | ICD-10-CM

## 2016-10-30 DIAGNOSIS — M48061 Spinal stenosis, lumbar region without neurogenic claudication: Secondary | ICD-10-CM

## 2016-10-30 DIAGNOSIS — M545 Low back pain: Secondary | ICD-10-CM | POA: Diagnosis present

## 2016-10-30 DIAGNOSIS — Z419 Encounter for procedure for purposes other than remedying health state, unspecified: Secondary | ICD-10-CM

## 2016-10-30 HISTORY — PX: LUMBAR LAMINECTOMY: SHX95

## 2016-10-30 LAB — URINALYSIS, ROUTINE W REFLEX MICROSCOPIC
BACTERIA UA: NONE SEEN
Bilirubin Urine: NEGATIVE
GLUCOSE, UA: NEGATIVE mg/dL
Ketones, ur: NEGATIVE mg/dL
Leukocytes, UA: NEGATIVE
Nitrite: NEGATIVE
PROTEIN: NEGATIVE mg/dL
SPECIFIC GRAVITY, URINE: 1.018 (ref 1.005–1.030)
Squamous Epithelial / LPF: NONE SEEN
pH: 5 (ref 5.0–8.0)

## 2016-10-30 SURGERY — MICRODISCECTOMY LUMBAR LAMINECTOMY
Anesthesia: General | Site: Back

## 2016-10-30 MED ORDER — BUPIVACAINE HCL (PF) 0.5 % IJ SOLN
INTRAMUSCULAR | Status: AC
  Filled 2016-10-30: qty 30

## 2016-10-30 MED ORDER — SODIUM CHLORIDE 0.9% FLUSH
3.0000 mL | Freq: Two times a day (BID) | INTRAVENOUS | Status: DC
  Administered 2016-10-30 – 2016-10-31 (×4): 3 mL via INTRAVENOUS

## 2016-10-30 MED ORDER — HYDROMORPHONE HCL 1 MG/ML IJ SOLN: 0.2500 mg | INTRAMUSCULAR | Status: DC | PRN

## 2016-10-30 MED ORDER — DOCUSATE SODIUM 100 MG PO CAPS
100.0000 mg | ORAL_CAPSULE | Freq: Two times a day (BID) | ORAL | Status: DC
  Administered 2016-10-30 – 2016-11-01 (×5): 100 mg via ORAL
  Filled 2016-10-30 (×5): qty 1

## 2016-10-30 MED ORDER — POLYETHYLENE GLYCOL 3350 17 G PO PACK: 17.0000 g | PACK | Freq: Every day | ORAL | Status: DC | PRN

## 2016-10-30 MED ORDER — ALBUMIN HUMAN 5 % IV SOLN
INTRAVENOUS | Status: DC | PRN
  Administered 2016-10-30: 09:00:00 via INTRAVENOUS

## 2016-10-30 MED ORDER — ROCURONIUM BROMIDE 10 MG/ML (PF) SYRINGE
PREFILLED_SYRINGE | INTRAVENOUS | Status: AC
  Filled 2016-10-30: qty 5

## 2016-10-30 MED ORDER — LIDOCAINE 2% (20 MG/ML) 5 ML SYRINGE
INTRAMUSCULAR | Status: DC | PRN
  Administered 2016-10-30: 100 mg via INTRAVENOUS

## 2016-10-30 MED ORDER — MIDAZOLAM HCL 2 MG/2ML IJ SOLN
INTRAMUSCULAR | Status: AC
  Filled 2016-10-30: qty 2

## 2016-10-30 MED ORDER — HYDROCODONE-ACETAMINOPHEN 5-325 MG PO TABS
1.0000 | ORAL_TABLET | ORAL | Status: DC | PRN
  Administered 2016-10-30 – 2016-10-31 (×3): 1 via ORAL
  Filled 2016-10-30 (×2): qty 1

## 2016-10-30 MED ORDER — METHOCARBAMOL 500 MG PO TABS
ORAL_TABLET | ORAL | Status: AC
  Filled 2016-10-30: qty 1

## 2016-10-30 MED ORDER — PHENYLEPHRINE HCL 10 MG/ML IJ SOLN
INTRAVENOUS | Status: DC | PRN
  Administered 2016-10-30: 20 ug/min via INTRAVENOUS

## 2016-10-30 MED ORDER — MELOXICAM 7.5 MG PO TABS
15.0000 mg | ORAL_TABLET | Freq: Every day | ORAL | Status: DC
  Administered 2016-10-31 – 2016-11-01 (×2): 15 mg via ORAL
  Filled 2016-10-30 (×2): qty 2

## 2016-10-30 MED ORDER — ONDANSETRON HCL 4 MG PO TABS: 4.0000 mg | ORAL_TABLET | Freq: Four times a day (QID) | ORAL | Status: DC | PRN

## 2016-10-30 MED ORDER — ONDANSETRON HCL 4 MG/2ML IJ SOLN
INTRAMUSCULAR | Status: DC | PRN
  Administered 2016-10-30: 4 mg via INTRAVENOUS

## 2016-10-30 MED ORDER — EPHEDRINE 5 MG/ML INJ
INTRAVENOUS | Status: AC
  Filled 2016-10-30: qty 10

## 2016-10-30 MED ORDER — FENTANYL CITRATE (PF) 250 MCG/5ML IJ SOLN
INTRAMUSCULAR | Status: AC
  Filled 2016-10-30: qty 5

## 2016-10-30 MED ORDER — MEPERIDINE HCL 25 MG/ML IJ SOLN: 6.2500 mg | INTRAMUSCULAR | Status: DC | PRN

## 2016-10-30 MED ORDER — MENTHOL 3 MG MT LOZG: 1.0000 | LOZENGE | OROMUCOSAL | Status: DC | PRN

## 2016-10-30 MED ORDER — MORPHINE SULFATE (PF) 2 MG/ML IV SOLN: 1.0000 mg | INTRAVENOUS | Status: DC | PRN

## 2016-10-30 MED ORDER — VITAMIN C 500 MG PO TABS
500.0000 mg | ORAL_TABLET | Freq: Every day | ORAL | Status: DC
  Administered 2016-10-31: 500 mg via ORAL
  Filled 2016-10-30: qty 1

## 2016-10-30 MED ORDER — FLEET ENEMA 7-19 GM/118ML RE ENEM: 1.0000 | ENEMA | Freq: Once | RECTAL | Status: DC | PRN

## 2016-10-30 MED ORDER — SUGAMMADEX SODIUM 200 MG/2ML IV SOLN
INTRAVENOUS | Status: DC | PRN
  Administered 2016-10-30: 200 mg via INTRAVENOUS

## 2016-10-30 MED ORDER — PHENOL 1.4 % MT LIQD: 1.0000 | OROMUCOSAL | Status: DC | PRN

## 2016-10-30 MED ORDER — FENTANYL CITRATE (PF) 100 MCG/2ML IJ SOLN
INTRAMUSCULAR | Status: DC | PRN
  Administered 2016-10-30: 50 ug via INTRAVENOUS
  Administered 2016-10-30: 150 ug via INTRAVENOUS

## 2016-10-30 MED ORDER — CEFAZOLIN SODIUM-DEXTROSE 2-4 GM/100ML-% IV SOLN
2.0000 g | Freq: Three times a day (TID) | INTRAVENOUS | Status: AC
  Administered 2016-10-30 – 2016-10-31 (×2): 2 g via INTRAVENOUS
  Filled 2016-10-30 (×2): qty 100

## 2016-10-30 MED ORDER — HYDROCODONE-ACETAMINOPHEN 5-325 MG PO TABS
ORAL_TABLET | ORAL | Status: AC
  Filled 2016-10-30: qty 1

## 2016-10-30 MED ORDER — ACETAMINOPHEN 325 MG PO TABS: 650.0000 mg | ORAL_TABLET | ORAL | Status: DC | PRN

## 2016-10-30 MED ORDER — CHLORHEXIDINE GLUCONATE 4 % EX LIQD: 60.0000 mL | Freq: Once | CUTANEOUS | Status: DC

## 2016-10-30 MED ORDER — ACETAMINOPHEN 650 MG RE SUPP: 650.0000 mg | RECTAL | Status: DC | PRN

## 2016-10-30 MED ORDER — GABAPENTIN 300 MG PO CAPS
300.0000 mg | ORAL_CAPSULE | Freq: Three times a day (TID) | ORAL | Status: DC
  Administered 2016-10-30 – 2016-11-01 (×6): 300 mg via ORAL
  Filled 2016-10-30 (×6): qty 1

## 2016-10-30 MED ORDER — EPHEDRINE SULFATE 50 MG/ML IJ SOLN
INTRAMUSCULAR | Status: DC | PRN
  Administered 2016-10-30: 10 mg via INTRAVENOUS
  Administered 2016-10-30: 5 mg via INTRAVENOUS
  Administered 2016-10-30: 10 mg via INTRAVENOUS
  Administered 2016-10-30: 5 mg via INTRAVENOUS

## 2016-10-30 MED ORDER — ADULT MULTIVITAMIN W/MINERALS CH
1.0000 | ORAL_TABLET | Freq: Every day | ORAL | Status: DC
  Administered 2016-10-31 – 2016-11-01 (×2): 1 via ORAL
  Filled 2016-10-30 (×3): qty 1

## 2016-10-30 MED ORDER — ASPIRIN EC 81 MG PO TBEC
81.0000 mg | DELAYED_RELEASE_TABLET | Freq: Every day | ORAL | Status: DC
  Administered 2016-10-31 – 2016-11-01 (×2): 81 mg via ORAL
  Filled 2016-10-30 (×2): qty 1

## 2016-10-30 MED ORDER — BUPIVACAINE HCL 0.5 % IJ SOLN
INTRAMUSCULAR | Status: DC | PRN
  Administered 2016-10-30: 5 mL
  Administered 2016-10-30: 15 mL

## 2016-10-30 MED ORDER — PROPOFOL 10 MG/ML IV BOLUS
INTRAVENOUS | Status: DC | PRN
Start: 2016-10-30 — End: 2016-10-30
  Administered 2016-10-30: 100 mg via INTRAVENOUS

## 2016-10-30 MED ORDER — LISINOPRIL 10 MG PO TABS
10.0000 mg | ORAL_TABLET | Freq: Every day | ORAL | Status: DC
  Administered 2016-10-31 – 2016-11-01 (×2): 10 mg via ORAL
  Filled 2016-10-30 (×2): qty 1

## 2016-10-30 MED ORDER — SUGAMMADEX SODIUM 200 MG/2ML IV SOLN
INTRAVENOUS | Status: AC
  Filled 2016-10-30: qty 2

## 2016-10-30 MED ORDER — BISOPROLOL-HYDROCHLOROTHIAZIDE 2.5-6.25 MG PO TABS
1.0000 | ORAL_TABLET | Freq: Every day | ORAL | Status: DC
  Administered 2016-10-31 – 2016-11-01 (×2): 1 via ORAL
  Filled 2016-10-30 (×2): qty 1

## 2016-10-30 MED ORDER — THROMBIN (RECOMBINANT) 20000 UNITS EX SOLR
OROMUCOSAL | Status: DC | PRN
  Administered 2016-10-30: 20 mL via TOPICAL

## 2016-10-30 MED ORDER — THROMBIN (RECOMBINANT) 20000 UNITS EX SOLR
CUTANEOUS | Status: AC
  Filled 2016-10-30: qty 20000

## 2016-10-30 MED ORDER — ONDANSETRON HCL 4 MG/2ML IJ SOLN: 4.0000 mg | Freq: Once | INTRAMUSCULAR | Status: DC | PRN

## 2016-10-30 MED ORDER — DEXAMETHASONE SODIUM PHOSPHATE 10 MG/ML IJ SOLN
INTRAMUSCULAR | Status: DC | PRN
  Administered 2016-10-30: 10 mg via INTRAVENOUS

## 2016-10-30 MED ORDER — HYDROCODONE-ACETAMINOPHEN 7.5-325 MG PO TABS
2.0000 | ORAL_TABLET | ORAL | Status: DC | PRN
  Administered 2016-10-30 – 2016-10-31 (×3): 2 via ORAL
  Filled 2016-10-30 (×3): qty 2

## 2016-10-30 MED ORDER — PROPOFOL 10 MG/ML IV BOLUS
INTRAVENOUS | Status: AC
  Filled 2016-10-30: qty 40

## 2016-10-30 MED ORDER — LIDOCAINE 2% (20 MG/ML) 5 ML SYRINGE
INTRAMUSCULAR | Status: AC
  Filled 2016-10-30: qty 5

## 2016-10-30 MED ORDER — CEFAZOLIN SODIUM-DEXTROSE 2-4 GM/100ML-% IV SOLN
2.0000 g | INTRAVENOUS | Status: AC
  Administered 2016-10-30: 2 g via INTRAVENOUS

## 2016-10-30 MED ORDER — BISACODYL 5 MG PO TBEC: 5.0000 mg | DELAYED_RELEASE_TABLET | Freq: Every day | ORAL | Status: DC | PRN

## 2016-10-30 MED ORDER — METHOCARBAMOL 500 MG PO TABS
500.0000 mg | ORAL_TABLET | Freq: Four times a day (QID) | ORAL | Status: DC | PRN
  Administered 2016-10-30: 500 mg via ORAL
  Filled 2016-10-30: qty 1

## 2016-10-30 MED ORDER — SODIUM CHLORIDE 0.9 % IV SOLN: 250.0000 mL | INTRAVENOUS | Status: DC

## 2016-10-30 MED ORDER — SODIUM CHLORIDE 0.9 % IV SOLN
INTRAVENOUS | Status: DC
  Administered 2016-10-30: 75 mL/h via INTRAVENOUS
  Administered 2016-10-30 – 2016-10-31 (×3): via INTRAVENOUS

## 2016-10-30 MED ORDER — METHOCARBAMOL 1000 MG/10ML IJ SOLN
500.0000 mg | Freq: Four times a day (QID) | INTRAVENOUS | Status: DC | PRN
  Filled 2016-10-30: qty 5

## 2016-10-30 MED ORDER — CEFAZOLIN SODIUM-DEXTROSE 2-4 GM/100ML-% IV SOLN
INTRAVENOUS | Status: AC
  Filled 2016-10-30: qty 100

## 2016-10-30 MED ORDER — ROCURONIUM BROMIDE 10 MG/ML (PF) SYRINGE
PREFILLED_SYRINGE | INTRAVENOUS | Status: DC | PRN
  Administered 2016-10-30: 25 mg via INTRAVENOUS
  Administered 2016-10-30: 50 mg via INTRAVENOUS
  Administered 2016-10-30: 25 mg via INTRAVENOUS

## 2016-10-30 MED ORDER — 0.9 % SODIUM CHLORIDE (POUR BTL) OPTIME
TOPICAL | Status: DC | PRN
  Administered 2016-10-30: 1000 mL

## 2016-10-30 MED ORDER — LACTATED RINGERS IV SOLN
INTRAVENOUS | Status: DC | PRN
  Administered 2016-10-30 (×2): via INTRAVENOUS

## 2016-10-30 MED ORDER — BUPIVACAINE LIPOSOME 1.3 % IJ SUSP
20.0000 mL | INTRAMUSCULAR | Status: AC
  Administered 2016-10-30: 10 mL
  Filled 2016-10-30: qty 20

## 2016-10-30 MED ORDER — ONDANSETRON HCL 4 MG/2ML IJ SOLN
INTRAMUSCULAR | Status: AC
  Filled 2016-10-30: qty 2

## 2016-10-30 MED ORDER — SODIUM CHLORIDE 0.9% FLUSH: 3.0000 mL | INTRAVENOUS | Status: DC | PRN

## 2016-10-30 MED ORDER — MIDAZOLAM HCL 5 MG/5ML IJ SOLN
INTRAMUSCULAR | Status: DC | PRN
  Administered 2016-10-30: 2 mg via INTRAVENOUS

## 2016-10-30 MED ORDER — ONDANSETRON HCL 4 MG/2ML IJ SOLN: 4.0000 mg | Freq: Four times a day (QID) | INTRAMUSCULAR | Status: DC | PRN

## 2016-10-30 MED ORDER — AMLODIPINE BESYLATE 5 MG PO TABS
5.0000 mg | ORAL_TABLET | Freq: Every day | ORAL | Status: DC
  Administered 2016-10-31 – 2016-11-01 (×2): 5 mg via ORAL
  Filled 2016-10-30 (×2): qty 1

## 2016-10-30 MED ORDER — DEXAMETHASONE SODIUM PHOSPHATE 10 MG/ML IJ SOLN
INTRAMUSCULAR | Status: AC
  Filled 2016-10-30: qty 1

## 2016-10-30 MED ORDER — ALUM & MAG HYDROXIDE-SIMETH 200-200-20 MG/5ML PO SUSP: 30.0000 mL | Freq: Four times a day (QID) | ORAL | Status: DC | PRN

## 2016-10-30 SURGICAL SUPPLY — 52 items
BENZOIN TINCTURE PRP APPL 2/3 (GAUZE/BANDAGES/DRESSINGS) ×3 IMPLANT
BUR SABER RD CUTTING 3.0 (BURR) ×2 IMPLANT
BUR SABER RD CUTTING 3.0MM (BURR) ×1
CANISTER SUCT 3000ML PPV (MISCELLANEOUS) ×3 IMPLANT
CLOSURE WOUND 1/2 X4 (GAUZE/BANDAGES/DRESSINGS) ×1
COVER MAYO STAND STRL (DRAPES) ×3 IMPLANT
COVER SURGICAL LIGHT HANDLE (MISCELLANEOUS) ×3 IMPLANT
DRAPE C-ARM 42X72 X-RAY (DRAPES) IMPLANT
DRAPE HALF SHEET 40X57 (DRAPES) IMPLANT
DRAPE MICROSCOPE LEICA (MISCELLANEOUS) ×3 IMPLANT
DRAPE SURG 17X23 STRL (DRAPES) ×12 IMPLANT
DRSG MEPILEX BORDER 4X4 (GAUZE/BANDAGES/DRESSINGS) IMPLANT
DRSG MEPILEX BORDER 4X8 (GAUZE/BANDAGES/DRESSINGS) ×3 IMPLANT
DURAPREP 26ML APPLICATOR (WOUND CARE) ×3 IMPLANT
ELECT BLADE 4.0 EZ CLEAN MEGAD (MISCELLANEOUS) ×3
ELECT CAUTERY BLADE 6.4 (BLADE) ×3 IMPLANT
ELECT REM PT RETURN 9FT ADLT (ELECTROSURGICAL) ×3
ELECTRODE BLDE 4.0 EZ CLN MEGD (MISCELLANEOUS) ×1 IMPLANT
ELECTRODE REM PT RTRN 9FT ADLT (ELECTROSURGICAL) ×1 IMPLANT
GLOVE BIOGEL PI IND STRL 8 (GLOVE) ×1 IMPLANT
GLOVE BIOGEL PI INDICATOR 8 (GLOVE) ×2
GLOVE ECLIPSE 8.5 STRL (GLOVE) ×3 IMPLANT
GLOVE ORTHO TXT STRL SZ7.5 (GLOVE) ×3 IMPLANT
GLOVE SURG 8.5 LATEX PF (GLOVE) ×3 IMPLANT
GOWN STRL REUS W/ TWL LRG LVL3 (GOWN DISPOSABLE) ×1 IMPLANT
GOWN STRL REUS W/TWL 2XL LVL3 (GOWN DISPOSABLE) ×6 IMPLANT
GOWN STRL REUS W/TWL LRG LVL3 (GOWN DISPOSABLE) ×2
KIT BASIN OR (CUSTOM PROCEDURE TRAY) ×3 IMPLANT
KIT ROOM TURNOVER OR (KITS) ×3 IMPLANT
NEEDLE SPNL 18GX3.5 QUINCKE PK (NEEDLE) ×6 IMPLANT
NS IRRIG 1000ML POUR BTL (IV SOLUTION) ×3 IMPLANT
PACK LAMINECTOMY ORTHO (CUSTOM PROCEDURE TRAY) ×3 IMPLANT
PAD ARMBOARD 7.5X6 YLW CONV (MISCELLANEOUS) ×6 IMPLANT
PATTIES SURGICAL .5 X.5 (GAUZE/BANDAGES/DRESSINGS) IMPLANT
PATTIES SURGICAL .75X.75 (GAUZE/BANDAGES/DRESSINGS) IMPLANT
PATTIES SURGICAL 1X1 (DISPOSABLE) ×3 IMPLANT
SPONGE LAP 4X18 X RAY DECT (DISPOSABLE) IMPLANT
SPONGE SURGIFOAM ABS GEL 100 (HEMOSTASIS) ×3 IMPLANT
STRIP CLOSURE SKIN 1/2X4 (GAUZE/BANDAGES/DRESSINGS) ×2 IMPLANT
SUT VIC AB 0 CT1 18XCR BRD8 (SUTURE) ×1 IMPLANT
SUT VIC AB 0 CT1 8-18 (SUTURE) ×2
SUT VIC AB 1 CTX 36 (SUTURE) ×2
SUT VIC AB 1 CTX36XBRD ANBCTR (SUTURE) ×1 IMPLANT
SUT VIC AB 2-0 CT1 27 (SUTURE) ×2
SUT VIC AB 2-0 CT1 TAPERPNT 27 (SUTURE) ×1 IMPLANT
SUT VIC AB 3-0 X1 27 (SUTURE) ×3 IMPLANT
SUT VICRYL 0 UR6 27IN ABS (SUTURE) ×3 IMPLANT
SYR 20CC LL (SYRINGE) ×3 IMPLANT
SYR CONTROL 10ML LL (SYRINGE) ×3 IMPLANT
TOWEL OR 17X24 6PK STRL BLUE (TOWEL DISPOSABLE) ×3 IMPLANT
TOWEL OR 17X26 10 PK STRL BLUE (TOWEL DISPOSABLE) ×3 IMPLANT
WATER STERILE IRR 1000ML POUR (IV SOLUTION) ×3 IMPLANT

## 2016-10-30 NOTE — Discharge Instructions (Signed)
No lifting greater than 10 lbs. Avoid bending, stooping and twisting. Walk in house for first week them may start to get out slowly increasing distance up to one mile by 3 weeks post op. Keep incision dry for 3 days, may use tegaderm or similar water impervious dressing.   Laminectomy, Care After This sheet gives you information about how to care for yourself after your procedure. Your health care provider may also give you more specific instructions. If you have problems or questions, contact your health care provider. What can I expect after the procedure? After the procedure, it is common to have:  Some pain around your incision area.  Muscle tightening (spasms) across the back.  Follow these instructions at home: Incision care  Follow instructions from your health care provider about how to take care of your incision area. Make sure you: ? Wash your hands with soap and water before and after you apply medicine to the area or change your bandage (dressing). If soap and water are not available, use hand sanitizer. ? Change your dressing as told by your health care provider. ? Leave stitches (sutures), skin glue, or adhesive strips in place. These skin closures may need to stay in place for 2 weeks or longer. If adhesive strip edges start to loosen and curl up, you may trim the loose edges. Do not remove adhesive strips completely unless your health care provider tells you to do that.  Check your incision area every day for signs of infection. Check for: ? More redness, swelling, or pain. ? More fluid or blood. ? Warmth. ? Pus or a bad smell. Medicines  Take over-the-counter and prescription medicines only as told by your health care provider.  If you were prescribed an antibiotic medicine, use it as told by your health care provider. Do not stop using the antibiotic even if you start to feel better. Bathing  Do not take baths, swim, or use a hot tub for 2 weeks, or until  your incision has healed completely.  If your health care provider approves, you may take showers after your dressing has been removed. Activity  Return to your normal activities as told by your health care provider. Ask your health care provider what activities are safe for you.  Avoid bending or twisting at your waist. Always bend at your knees.  Do not sit for more than 20-30 minutes at a time. Lie down or walk between periods of sitting.  Do not lift anything that is heavier than 10 lb (4.5 kg) or the limit that your health care provider tells you, until he or she says that it is safe.  Do not drive for 2 weeks after your procedure or for as long as your health care provider tells you.  Do not drive or use heavy machinery while taking prescription pain medicine. General instructions  To prevent or treat constipation while you are taking prescription pain medicine, your health care provider may recommend that you: ? Drink enough fluid to keep your urine clear or pale yellow. ? Take over-the-counter or prescription medicines. ? Eat foods that are high in fiber, such as fresh fruits and vegetables, whole grains, and beans. ? Limit foods that are high in fat and processed sugars, such as fried and sweet foods.  Do breathing exercises as told.  Keep all follow-up visits as told by your health care provider. This is important. Contact a health care provider if:  You have more redness, swelling, or  pain around your incision area.  Your incision feels warm to the touch.  You are not able to return to activities or do exercises as told by your health care provider. Get help right away if:  You have: ? More fluid or blood coming from your incision area. ? Pus or a bad smell coming from your incision area. ? Chills or a fever. ? Episodes of dizziness or fainting while standing.  You develop a rash.  You develop shortness of breath or you have difficulty breathing.  You cannot  control when you urinate or have a bowel movement.  You become weak.  You are not able to use your legs. Summary  After the procedure, it is common to have some pain around your incision area. You may also have muscle tightening (spasms) across the back.  Follow instructions from your health care provider about how to care for your incision.  Do not lift anything that is heavier than 10 lb (4.5 kg) or the limit that your health care provider tells you, until he or she says that it is safe.  Contact your health care provider if you have more redness, swelling, or pain around your incision area or if your incision feels warm to the touch. These can be signs of infection. This information is not intended to replace advice given to you by your health care provider. Make sure you discuss any questions you have with your health care provider. Document Released: 07/14/2004 Document Revised: 08/11/2015 Document Reviewed: 06/12/2015 Elsevier Interactive Patient Education  2018 [employer]

## 2016-10-30 NOTE — Anesthesia Postprocedure Evaluation (Signed)
Anesthesia Post Note  Patient: Suan HalterDouglas E Salomone  Procedure(s) Performed: BILATERAL PARTIAL HEMILAMINECTOMIES Lumbar three-four, , Lumbar four-five  and Lumbar five-Sacrum one (N/A Back)     Patient location during evaluation: PACU Anesthesia Type: General Level of consciousness: awake and alert Pain management: pain level controlled Vital Signs Assessment: post-procedure vital signs reviewed and stable Respiratory status: spontaneous breathing, nonlabored ventilation, respiratory function stable and patient connected to nasal cannula oxygen Cardiovascular status: blood pressure returned to baseline and stable Postop Assessment: no apparent nausea or vomiting Anesthetic complications: no    Last Vitals:  Vitals:   10/30/16 1230 10/30/16 1305  BP: (!) 149/76 125/63  Pulse: (!) 50 75  Resp: 20 18  Temp: 36.4 C 36.8 C  SpO2: 95% 95%    Last Pain:  Vitals:   10/30/16 1308  TempSrc:   PainSc: 2                  Tashi Band DAVID

## 2016-10-30 NOTE — H&P (Signed)
Ian Hendricks is an 76 y.o. male.   Chief Complaint: back pain and leg pain HPI: patient with hx of L3-S1 stenosis and above complaint presents for surgical intervention.  Progressively worsening symptoms.  Failed conservative treatment.   Past Medical History:  Diagnosis Date  . Arthritis   . Early cataracts, bilateral   . Headache    PMH  . History of kidney stones   . HOH (hard of hearing)   . Hypertension   . Lumbar stenosis   . Wears dentures     Past Surgical History:  Procedure Laterality Date  . ABDOMINAL EXPOSURE     to remove a kidney stone  . COLONOSCOPY    . FRACTURE SURGERY     right ankle  . HERNIA REPAIR     B/L inguinal  . MULTIPLE TOOTH EXTRACTIONS      History reviewed. No pertinent family history. Social History:  reports that he has never smoked. He has never used smokeless tobacco. He reports that he does not drink alcohol or use drugs.  Allergies: No Known Allergies  Medications Prior to Admission  Medication Sig Dispense Refill  . amLODipine (NORVASC) 5 MG tablet Take 5 mg by mouth daily.     Marland Kitchen. aspirin EC 81 MG tablet Take 81 mg by mouth daily.    . bisoprolol-hydrochlorothiazide (ZIAC) 2.5-6.25 MG tablet Take 1 tablet by mouth daily.    . Calcium Carbonate (CALCIUM 600 PO) Take 1 tablet by mouth daily.    Marland Kitchen. ibuprofen (ADVIL,MOTRIN) 200 MG tablet Take 200-600 mg by mouth every 6 (six) hours as needed for mild pain.    Marland Kitchen. lisinopril (PRINIVIL,ZESTRIL) 10 MG tablet Take 10 mg by mouth daily.    . meloxicam (MOBIC) 15 MG tablet Take 15 mg by mouth daily.    . Misc Natural Products (COMPLETE PROSTATE HEALTH PO) Take 1 tablet by mouth daily.    . Multiple Vitamin (MULTI-VITAMIN PO) Take 1 tablet by mouth daily.    . vitamin C (ASCORBIC ACID) 500 MG tablet Take 500 mg by mouth daily.      Results for orders placed or performed during the hospital encounter of 10/30/16 (from the past 48 hour(s))  Urinalysis, Routine w reflex microscopic     Status:  Abnormal   Collection Time: 10/30/16  5:36 AM  Result Value Ref Range   Color, Urine YELLOW YELLOW   APPearance CLEAR CLEAR   Specific Gravity, Urine 1.018 1.005 - 1.030   pH 5.0 5.0 - 8.0   Glucose, UA NEGATIVE NEGATIVE mg/dL   Hgb urine dipstick SMALL (A) NEGATIVE   Bilirubin Urine NEGATIVE NEGATIVE   Ketones, ur NEGATIVE NEGATIVE mg/dL   Protein, ur NEGATIVE NEGATIVE mg/dL   Nitrite NEGATIVE NEGATIVE   Leukocytes, UA NEGATIVE NEGATIVE   RBC / HPF 0-5 0 - 5 RBC/hpf   WBC, UA 0-5 0 - 5 WBC/hpf   Bacteria, UA NONE SEEN NONE SEEN   Squamous Epithelial / LPF NONE SEEN NONE SEEN   Mucus PRESENT    No results found.  Review of Systems  Constitutional: Negative.   HENT: Negative.   Respiratory: Negative.   Cardiovascular: Negative.   Gastrointestinal: Negative.   Genitourinary: Negative.   Musculoskeletal: Positive for back pain.  Skin: Negative.   Neurological: Positive for tingling.  Psychiatric/Behavioral: Negative.     Blood pressure (!) 169/74, pulse 63, temperature 98.7 F (37.1 C), temperature source Oral, resp. rate 19, height 5\' 9"  (1.753 m), weight 185 lb  1.6 oz (84 kg), SpO2 95 %. Physical Exam  Constitutional: He is oriented to person, place, and time. No distress.  HENT:  Head: Normocephalic and atraumatic.  Eyes: Pupils are equal, round, and reactive to light. EOM are normal.  Neck: Normal range of motion.  Respiratory: No respiratory distress.  GI: He exhibits no distension.  Musculoskeletal: He exhibits tenderness.  Neurological: He is alert and oriented to person, place, and time.  Skin: Skin is warm and dry.  Psychiatric: He has a normal mood and affect.     Assessment/Plan L3-S1 stenosis  We will proceed with BILATERAL PARTIAL HEMILAMINECTOMIES L3-4, L4-5 and L5-S1 as scheduled.  Surgical procedure along with possible risks and complications discussed.   All questions answered and wishes to proceed.   Zonia Kief, PA-C 10/30/2016, 6:34 AM

## 2016-10-30 NOTE — Op Note (Signed)
10/30/2016  11:24 AM     PATIENT:  Ian Hendricks  76 y.o. male     MRN: [id number]     OPERATIVE REPORT     PRE-OPERATIVE DIAGNOSIS:  lumbar spinal stenosis L3-4, L4-5 and L5-S1     POST-OPERATIVE DIAGNOSIS:  lumbar spinal stenosis Lumbar three-four , Lumbar four-five and Lumbar five-Sacrum one     PROCEDURE:  Procedure(s):    BILATERAL PARTIAL HEMILAMINECTOMIES Lumbar three-four, , Lumbar four-five  and Lumbar five-Sacrum one       SURGEON:  Jessy Oto, MD        ASSISTANT:  Benjiman Core, PA-C  (Present throughout the entire procedure and necessary for completion of procedure in a timely manner)        ANESTHESIA:  General,supplemented with local marcaine 0.5% 1:1 exparel 1.3% total 30cc, Dr. Lillia Abed.       COMPLICATIONS:  None.        DRAINS: Foley to SD.     EBL: 300CC  PROCEDURE: The patient was met in the holding area, and the appropriate lumbar levels left L3-4,L4-5 and L5-S1 levels identified and marked with an "X" and my initials. I had discussion with the patient in the preop holding area regarding a change of consent form. Patient understands the rationale to perform multiple bilateral partial hemilaminectomies decompress the bilateral  L3-4, L4-5 and L5-S1 levels for lateral recess and foramenal stenosis.  The patient was then transported to OR and was placed under general anesthetic without difficulty. The patient received appropriate preoperative antibiotic prophylaxis 2 gm Ancef. Nursing staff inserted a Foley catheter under sterile conditions. The patient was then turned to a prone position using the Elizabeth spine frame. PAS. all pressure points well padded the arms at the side to 9090. Standard prep with DuraPrep solution draped in the usual manner from the lower dorsal spine the mid sacral segment. Iodine Vi-Drape was used and the old incision scar was marked. Time-out procedure was called and correct. Skin in the midline between L3 and L5 was then  infiltrated with local anesthesia, marcaine 1/2% 1:1 exparel 1.3% total 20 cc used. Incision was then made extending from L3-L5 through the skin and subcutaneous layers ellipsing the old incision down to the patient's lumbodorsal fascia and spinous processes. The incision then carried sharply lateral to the supraspinous ligament and then continuing the lateral aspect of the spinous processes of  L4, L5 and S1. Cobb elevator used to carefully elevate the paralumbar muscles off of the posterior elements using electrocautery carefully drilled bleeding and perform dissection of the muscle tissues of the preserving the facet capsule at the L3-4, L4-5 and L5-S1. Continuing the exposure out laterally to expose the lateral margin of the facet joint line at L2-3 L3-4, L4-5 and L5-S1. Incision was carried in the midline down to the S1 level area bleeders controlled using electrocautery monopolar electrocautery.   Cross table radiograph then used to identify a Kocher clamp place at the interspinous process space L4-5, the L4 spinous process then marked with an OR marking pen. Exposure bilateral posterior elements from L3-4 to L5-S1 and Boss McCoullough retractor was used for exposure. High-speed bur then used to remove the inferior portion of the left L4 and L5 lamina over 20% with removal of the inferior articular process of L5 side first then right side approximately 20% resected. High-speed bur then used to carefully expose screws insertion site of the ligamentum flavum to the ventral aspect of the lamina of L5. The  ligamentum flavum was then effected with fine tooth forceps and the ligamentum flavum which was thickened and redundant resected off the medial aspect of the facet at the left L5-S1 level. Foraminotomy performed a left L5 S1 level primarily over S1. Osteotome then used to resect the medial left L5-S1 facet of about 25% removing the ligamentum flavum insertion into this medial border of the superior  articular process of S1. Similarly this was performed on the right side resecting ligamentum flavum and found to be hypertrophic although left side also have the appearance of previous laminotomy for decompression purposes. Soft tissue removed over the posterior aspect of the left L5-S1 interlaminar region. Loupe magnification and headlight were used initially then the sterily draped OR microscduring this portion procedure. A ball tipped nerve hook could be passed out the left  L5 and S1 neuroforamen demonstrating their patency. High-speed bur then used to remove the inferior portion of the left L3 and L4 lamina over 20% with removal of the left inferior articular process of L3 and L4 first then right side approximately 20% resected. High-speed bur then used to carefully expose the insertion site of the ligamentum flavum to the ventral aspect of the lamina of L3 and L4 . The ligamentum flavum was then effected with finetooth forceps and the ligamentum flavum which was thickened and redundant resected off the medial aspect of the facet at the L3-4 and L4-5 levels. Foraminotomy performed a left L3-4 level primarily over L4. Osteotome then used to resect the medial L3-4 and L4-5 facets removing the ligamentum flavum insertion into this medial border of the superior articular process of L4,and L5. Similarly this was performed on the right side resecting ligamentum flavum and found to be hypertrophic although right side.  Carefully the ligamentum flavum was freed off of the bony surfaces the right L3-4 and L4-5 medial articular facets at about 15-20%. This was removed using 2 and 3 mm Kerrisons both L5, L4 and L3  nerve roots were well decompressed as were the foramen L5 nerve root identified and L5 nerve root decompressed. A ball tipped nerve hook could be passed out both L3, L4 and L5 neuroforamen demonstrating their patency.Each of the posterior disc levels was evaluated for disc protrusion and no soft disc herniation  noted at any of the levels.A large amount of hypertrophic ligmentum flavum was found impressing on the lateral recesses at L3-4,L4-5 and L5-S1 and narrowing the respective L3,L4, L5 and S1, neuroforamen.The bleeders were controlled using bipolar electrocautery thrombin-soaked Gelfoam were appropriate. Irrigation was carried out with copious amounts of saline solution this was done throughout the case.Hockey stick neuroprobe was used to probe the neuroforamen bilateral L3, L4, L5 and S1, these were determined to be well decompressed.Gelfoam was then removed spinal canal at each of the various laminotomy sites bilateral  L3-4, L4-5, and L5-S1 levels.The lumbodorsal musculature carefully exam debrided of any devitalized tissue following removal of Boss retractors were the bleeders were controlled using electrocautery and the area dorsal lumbar muscle were then approximated in the midline with interrupted #1 Vicryl sutures loose the dorsal fascia was reattached to the spinous process of L3 to S1 inferiorly this was done with #1 Vicryl sutures. Subcutaneous layers then approximated using interrupted 0 Vicryl sutures and 2-0 Vicryl sutures. Skin was closed with a running subcutaneous stitch of 4-0 Vicryl Dermabond was applied then MedPlex bandage. All instrument and sponge counts were correct. The patient was then returned to a supine position on her bed reactivated extubated and returned  to the recovery room in satisfactory condition.    Benjiman Core, PA-C performed the duties of assistant surgeon throughout this case he assisted using loupe magnification and OR microscope retracting delicate neural structures suctioning over the neural structures throughout the case bilaterally . He was present from the beginning of the case to the end of the case. Assisted in positioning the patient and  the arm and legs. He also participated in removal the patient from the operating table.    Basil Dess  10/30/2016, 11:24  AM

## 2016-10-30 NOTE — Anesthesia Preprocedure Evaluation (Signed)
Anesthesia Evaluation  Patient identified by MRN, date of birth, ID band Patient awake    Reviewed: Allergy & Precautions, NPO status , Patient's Chart, lab work & pertinent test results  Airway Mallampati: I  TM Distance: >3 FB Neck ROM: Full    Dental   Pulmonary    Pulmonary exam normal        Cardiovascular hypertension, Pt. on medications Normal cardiovascular exam     Neuro/Psych    GI/Hepatic   Endo/Other    Renal/GU      Musculoskeletal   Abdominal   Peds  Hematology   Anesthesia Other Findings   Reproductive/Obstetrics                             Anesthesia Physical Anesthesia Plan  ASA: II  Anesthesia Plan: General   Post-op Pain Management:    Induction: Intravenous  PONV Risk Score and Plan: 2 and Ondansetron, Dexamethasone and Treatment may vary due to age or medical condition  Airway Management Planned: Oral ETT  Additional Equipment:   Intra-op Plan:   Post-operative Plan: Extubation in OR  Informed Consent: I have reviewed the patients History and Physical, chart, labs and discussed the procedure including the risks, benefits and alternatives for the proposed anesthesia with the patient or authorized representative who has indicated his/her understanding and acceptance.     Plan Discussed with: CRNA and Surgeon  Anesthesia Plan Comments:         Anesthesia Quick Evaluation

## 2016-10-30 NOTE — Progress Notes (Signed)
Patient arrived to unit.  Alert, verbally pleasant.  No complaints of pain or discomfort.  Adhesive bandage to lower back, clean dry and intact.  Patient able to log roll side to side.  Demonstrated use of call light.

## 2016-10-30 NOTE — Brief Op Note (Signed)
10/30/2016  11:03 AM  PATIENT:  Ian Hendricks  76 y.o. male  PRE-OPERATIVE DIAGNOSIS:  lumbar spinal stenosis L3-4, L4-5 and L5-S1  POST-OPERATIVE DIAGNOSIS:  lumbar spinal stenosis Lumbar three-four , Lumbar four-five and Lumbar five-Sacrum one  PROCEDURE:  Procedure(s): BILATERAL PARTIAL HEMILAMINECTOMIES Lumbar three-four, , Lumbar four-five  and Lumbar five-Sacrum one (N/A)  SURGEON:  Surgeon(s) and Role:     Kerrin ChampagneNitka, Alyscia Carmon E, MD - Primary  PHYSICIAN ASSISTANT: Andee LinemanJames Owens,PA-C  ANESTHESIA:   local and general, Dr. Arta BruceKevin Ossey.  EBL:  300 mL   BLOOD ADMINISTERED:none  DRAINS: Urinary Catheter (Foley)   LOCAL MEDICATIONS USED:  MARCAINE 0.5% 1:1 EXPAREL 1.3% Amount: 30 ml  SPECIMEN:  No Specimen  DISPOSITION OF SPECIMEN:  N/A  COUNTS:  YES   COMPLICATIONS: None  TOURNIQUET:  * No tourniquets in log *  DICTATION: .Dragon Dictation  PLAN OF CARE: Admit to inpatient   PATIENT DISPOSITION:  PACU - hemodynamically stable.   Delay start of Pharmacological VTE agent (>24hrs) due to surgical blood loss or risk of bleeding: yes

## 2016-10-30 NOTE — Progress Notes (Signed)
Physical Therapy Evaluation Patient Details Name: Ian Hendricks MRN: [id number] DOB: [birth year] Today's Date: 10/30/2016   History of Present Illness  Pt is a 76 y.o. male with hx of L3-S1 stenosis. S/p bilateral partial hemilaminectomies at L3-S1.  Clinical Impression  Evaluated patient and educated him on surgical precautions, bed mobility, transfers, and car transfers. Demonstrates good prognosis and capacity for return to prior level of function. Further PT services are not recommended at this time. PT will sign off.    Follow Up Recommendations No PT follow up;Supervision - Intermittent    Equipment Recommendations  None recommended by PT    Recommendations for Other Services       Precautions / Restrictions Precautions Precautions: Back Precaution Booklet Issued: Yes (comment) (handout) Precaution Comments: reviewed surgical precautions and handout with patient Required Braces or Orthoses:  (Per MD - no brace required) Restrictions Weight Bearing Restrictions: No      Mobility  Bed Mobility Overal bed mobility: Needs Assistance Bed Mobility: Rolling;Sidelying to Sit Rolling: Supervision Sidelying to sit: Supervision       General bed mobility comments: no physical assist required; cues for log roll technique. Pt utilized bed rails and able to transition to EOB  Transfers Overall transfer level: Needs assistance Equipment used: None Transfers: Sit to/from Stand Sit to Stand: Supervision         General transfer comment: min cues to scoot forward and have optimal LE position for stand; no physical assit required  Ambulation/Gait Ambulation/Gait assistance: Modified independent (Device/Increase time) Ambulation Distance (Feet): 500 Feet Assistive device: None Gait Pattern/deviations: Step-through pattern;WFL(Within Functional Limits)   Gait velocity interpretation: at or above normal speed for age/gender General Gait Details: steady pacing, no  instability or LOB noted. Pt did not demonstrate significant deficits while ambulating. Reports that he's walking the best he has in months without pain after surgery.  Stairs Stairs: Yes Stairs assistance: Supervision Stair Management: One rail Right;Alternating pattern Number of Stairs: 5 General stair comments: supervision for safety and management of lines; no physical assit required  Wheelchair Mobility    Modified Rankin (Stroke Patients Only)       Balance Overall balance assessment: Needs assistance Sitting-balance support: Feet supported;No upper extremity supported Sitting balance-Leahy Scale: Normal Sitting balance - Comments: pt able to maintain sitting EOB; and able to weight shift to advance LEs foward   Standing balance support: During functional activity;No upper extremity supported Standing balance-Leahy Scale: Good Standing balance comment: did not significantly challenge static standing; no instability or LOB noted             High level balance activites: Backward walking;Direction changes;Head turns;Turns;Sudden stops High Level Balance Comments: no instability or LOB noted with these challenges.             Pertinent Vitals/Pain Pain Assessment: No/denies pain    Home Living Family/patient expects to be discharged to:: Private residence Living Arrangements: Spouse/significant other Available Help at Discharge: Family;Available 24 hours/day Type of Home: House Home Access: Stairs to enter Entrance Stairs-Rails: None Entrance Stairs-Number of Steps: 1 Home Layout: One level Home Equipment: Cane - single point;Walker - 2 wheels;Crutches      Prior Function Level of Independence: Independent         Comments: pt reports that he has several assistive devices on standby just in case but doesn't need them for mobility. He drives and enjoys being active.      Hand Dominance        Extremity/Trunk Assessment  Upper Extremity  Assessment Upper Extremity Assessment: Overall WFL for tasks assessed    Lower Extremity Assessment Lower Extremity Assessment: Overall WFL for tasks assessed (strength 5/5 bilateral; sensation intact to LT)    Cervical / Trunk Assessment Cervical / Trunk Assessment: Other exceptions Cervical / Trunk Exceptions: s/p bilateral partial hemilaminectomies at L3-S1.  Communication   Communication: HOH  Cognition Arousal/Alertness: Awake/alert Behavior During Therapy: WFL for tasks assessed/performed Overall Cognitive Status: Within Functional Limits for tasks assessed                                        General Comments General comments (skin integrity, edema, etc.): Pt was educated on surgical precautions, log roll technique, and transfers    Exercises     Assessment/Plan    PT Assessment Patent does not need any further PT services  PT Problem List         PT Treatment Interventions      PT Goals (Current goals can be found in the Care Plan section)  Acute Rehab PT Goals Patient Stated Goal: to go home PT Goal Formulation: With patient Time For Goal Achievement: 11/13/16 Potential to Achieve Goals: Good    Frequency     Barriers to discharge        Co-evaluation               AM-PAC PT "6 Clicks" Daily Activity  Outcome Measure Difficulty turning over in bed (including adjusting bedclothes, sheets and blankets)?: A Little Difficulty moving from lying on back to sitting on the side of the bed? : A Little Difficulty sitting down on and standing up from a chair with arms (e.g., wheelchair, bedside commode, etc,.)?: A Little Help needed moving to and from a bed to chair (including a wheelchair)?: None Help needed walking in hospital room?: None Help needed climbing 3-5 steps with a railing? : A Little 6 Click Score: 20    End of Session Equipment Utilized During Treatment: Gait belt Activity Tolerance: Patient tolerated treatment  well Patient left: in chair;with call bell/phone within reach Nurse Communication: Mobility status PT Visit Diagnosis: Difficulty in walking, not elsewhere classified (R26.2)    Time: [id number] PT Time Calculation (min) (ACUTE ONLY): 19 min   Charges:   PT Evaluation $PT Eval Low Complexity: 1 Low     PT G Codes:   PT G-Codes **NOT FOR INPATIENT CLASS** Functional Assessment Tool Used: Clinical judgement Functional Limitation: Mobility: Walking and moving around Mobility: Walking and Moving Around Current Status ([id number](G8978): At least 1 percent but less than 20 percent impaired, limited or restricted Mobility: Walking and Moving Around Goal Status [phone](G8979): At least 1 percent but less than 20 percent impaired, limited or restricted Mobility: Walking and Moving Around Discharge Status [phone](G8980): At least 1 percent but less than 20 percent impaired, limited or restricted    A. [place] RecordsJamila Mcarthur Ivins, SPT #[phone] office   Fonnie Birkenheadndria J  Jemario Poitras 10/30/2016, 4:26 PM

## 2016-10-30 NOTE — Interval H&P Note (Signed)
History and Physical Interval Note:  10/30/2016 7:25 AM  Ian Hendricks  has presented today for surgery, with the diagnosis of lumbar spinal stenosis L3-4, L4-5 and L5-S1  The various methods of treatment have been discussed with the patient and family. After consideration of risks, benefits and other options for treatment, the patient has consented to  Procedure(s): BILATERAL PARTIAL HEMILAMINECTOMIES L3-4, L4-5 and L5-S1 (N/A) as a surgical intervention .  The patient's history has been reviewed, patient examined, no change in status, stable for surgery.  I have reviewed the patient's chart and labs.  Questions were answered to the patient's satisfaction.     Vira BrownsJames Key Cen

## 2016-10-30 NOTE — Anesthesia Procedure Notes (Signed)
Procedure Name: Intubation Date/Time: 10/30/2016 7:47 AM Performed by: Teressa Lower Pre-anesthesia Checklist: Patient identified, Emergency Drugs available, Suction available and Patient being monitored Patient Re-evaluated:Patient Re-evaluated prior to induction Oxygen Delivery Method: Circle system utilized Preoxygenation: Pre-oxygenation with 100% oxygen Induction Type: IV induction Ventilation: Mask ventilation without difficulty Laryngoscope Size: Mac and 3 Grade View: Grade I Tube type: Oral Tube size: 7.5 mm Number of attempts: 1 Airway Equipment and Method: Stylet and Oral airway Placement Confirmation: ETT inserted through vocal cords under direct vision,  positive ETCO2 and breath sounds checked- equal and bilateral Secured at: 22 cm Tube secured with: Tape Dental Injury: Teeth and Oropharynx as per pre-operative assessment

## 2016-10-30 NOTE — Transfer of Care (Signed)
Immediate Anesthesia Transfer of Care Note  Patient: Ian HalterDouglas E Mesa  Procedure(s) Performed: BILATERAL PARTIAL HEMILAMINECTOMIES Lumbar three-four, , Lumbar four-five  and Lumbar five-Sacrum one (N/A Back)  Patient Location: PACU  Anesthesia Type:General  Level of Consciousness: drowsy  Airway & Oxygen Therapy: Patient Spontanous Breathing and Patient connected to face mask oxygen  Post-op Assessment: Report given to RN and Post -op Vital signs reviewed and stable  Post vital signs: Reviewed and stable  Last Vitals:  Vitals:   10/30/16 0608  BP: (!) 169/74  Pulse: 63  Resp: 19  Temp: 37.1 C  SpO2: 95%    Last Pain:  Vitals:   10/30/16 0608  TempSrc: Oral  PainSc:       Patients Stated Pain Goal: 0 (10/30/16 0603)  Complications: No apparent anesthesia complications

## 2016-10-31 ENCOUNTER — Encounter (HOSPITAL_COMMUNITY): Payer: Self-pay | Admitting: Specialist

## 2016-10-31 LAB — BASIC METABOLIC PANEL
ANION GAP: 10 (ref 5–15)
BUN: 13 mg/dL (ref 6–20)
CALCIUM: 8.6 mg/dL — AB (ref 8.9–10.3)
CHLORIDE: 103 mmol/L (ref 101–111)
CO2: 26 mmol/L (ref 22–32)
Creatinine, Ser: 0.95 mg/dL (ref 0.61–1.24)
GFR calc non Af Amer: >60 mL/min (ref >60)
GLUCOSE: 150 mg/dL — AB (ref 65–99)
Potassium: 3.5 mmol/L (ref 3.5–5.1)
Sodium: 139 mmol/L (ref 135–145)

## 2016-10-31 LAB — CBC
HEMATOCRIT: 38.4 % — AB (ref 39.0–52.0)
HEMOGLOBIN: 12.5 g/dL — AB (ref 13.0–17.0)
MCH: 29.6 pg (ref 26.0–34.0)
MCHC: 32.6 g/dL (ref 30.0–36.0)
MCV: 91 fL (ref 78.0–100.0)
Platelets: 180 10*3/uL (ref 150–400)
RBC: 4.22 MIL/uL (ref 4.22–5.81)
RDW: 12.9 % (ref 11.5–15.5)
WBC: 12.6 10*3/uL — AB (ref 4.0–10.5)

## 2016-10-31 NOTE — Telephone Encounter (Signed)
Thanks for note and for scheduling this patient quickly. He is doing well. jen

## 2016-10-31 NOTE — Progress Notes (Signed)
     Subjective: 1 Day Post-Op Procedure(s) (LRB): BILATERAL PARTIAL HEMILAMINECTOMIES Lumbar three-four, , Lumbar four-five  and Lumbar five-Sacrum one (N/A)Awake, alert and oriented x 4. Seen while with PT in the therapy room.   Patient reports pain as moderate.    Objective:   VITALS:  Temp:  [98.1 F (36.7 C)-98.5 F (36.9 C)] 98.4 F (36.9 C) (10/24 1337) Pulse Rate:  [60-88] 62 (10/24 1337) Resp:  [18-20] 18 (10/24 1337) BP: (97-122)/(47-63) 97/47 (10/24 1337) SpO2:  [90 %-95 %] 94 % (10/24 1337)  Neurologically intact ABD soft Neurovascular intact Sensation intact distally Intact pulses distally Dorsiflexion/Plantar flexion intact Incision: scant drainage   LABS  Recent Labs  10/31/16 0327  HGB 12.5*  WBC 12.6*  PLT 180    Recent Labs  10/31/16 0327  NA 139  K 3.5  CL 103  CO2 26  BUN 13  CREATININE 0.95  GLUCOSE 150*   No results for input(s): LABPT, INR in the last 72 hours.   Assessment/Plan: 1 Day Post-Op Procedure(s) (LRB): BILATERAL PARTIAL HEMILAMINECTOMIES Lumbar three-four, , Lumbar four-five  and Lumbar five-Sacrum one (N/A)  Advance diet Up with therapy D/C IV fluids Plan for discharge tomorrow Discharge home with home health  Vira BrownsJames Nitka 10/31/2016, 2:37 PM Patient ID: Ian Hendricks, male   DOB: 1940-03-09, 76 y.o.   MRN: [id number]

## 2016-10-31 NOTE — Evaluation (Signed)
Occupational Therapy Evaluation Patient Details Name: Ian Hendricks MRN: [id number] DOB: 09/04/1940 Today's Date: 10/31/2016    History of Present Illness Pt is a 76 y.o. male with hx of L3-S1 stenosis. S/p bilateral partial hemilaminectomies at L3-S1.   Clinical Impression   PTA, pt was living with his wife and was independent. Currently, pt requires supervision-Min guard for ADLs and functional mobility using. Provided education on LB ADLs, toilet transfer, and tub transfer with shower seat; pt demonstrated understanding and adherence to back precautions. Answered all pt questions. Recommend dc home once medically stable per physician. All acute OT needs met and will sign off. Thank you.     Follow Up Recommendations  No OT follow up;Supervision/Assistance - 24 hour    Equipment Recommendations  None recommended by OT    Recommendations for Other Services       Precautions / Restrictions Precautions Precautions: Back Precaution Booklet Issued:  (Pt with handout in room.) Precaution Comments: Pt using handout to recall precautions. Pt able to recall no twisting or bending. Needed VCs for no lifting Required Braces or Orthoses:  (Per MD order, no brace requires. ) Restrictions Weight Bearing Restrictions: No      Mobility Bed Mobility               General bed mobility comments: Up in recliner upon arrival  Transfers Overall transfer level: Needs assistance Equipment used: None Transfers: Sit to/from Stand Sit to Stand: Supervision         General transfer comment: supervision for safety    Balance Overall balance assessment: Needs assistance Sitting-balance support: Feet supported;No upper extremity supported Sitting balance-Leahy Scale: Normal     Standing balance support: During functional activity;No upper extremity supported Standing balance-Leahy Scale: Good                             ADL either performed or assessed with  clinical judgement   ADL Overall ADL's : Needs assistance/impaired                                       General ADL Comments: Pt performing ADLs and funcitonal mobility at supervision-Min guard level. Provided education on LB ADLs, toilet transfer, and tub transfer. Pt demonstrating understanding and adhering to precautions. Feel pt will progress well with time.      Vision Baseline Vision/History: Wears glasses Wears Glasses: At all times Patient Visual Report: No change from baseline       Perception     Praxis      Pertinent Vitals/Pain Pain Assessment: 0-10 Pain Score: 1  Pain Location: Back Pain Descriptors / Indicators: Discomfort;Sore Pain Intervention(s): Monitored during session     Hand Dominance     Extremity/Trunk Assessment Upper Extremity Assessment Upper Extremity Assessment: Overall WFL for tasks assessed   Lower Extremity Assessment Lower Extremity Assessment: Overall WFL for tasks assessed   Cervical / Trunk Assessment Cervical / Trunk Assessment: Other exceptions Cervical / Trunk Exceptions: s/p bilateral partial hemilaminectomies at L3-S1.   Communication Communication Communication: HOH   Cognition Arousal/Alertness: Awake/alert Behavior During Therapy: WFL for tasks assessed/performed Overall Cognitive Status: Within Functional Limits for tasks assessed                                       General Comments       Exercises     Shoulder Instructions      Home Living Family/patient expects to be discharged to:: Private residence Living Arrangements: Spouse/significant other Available Help at Discharge: Family;Available 24 hours/day Type of Home: House Home Access: Stairs to enter [employer] of Steps: 1 Entrance Stairs-Rails: None Home Layout: One level     Bathroom Shower/Tub: Tub/shower unit;Curtain   Biochemist, clinical: Standard (Toilet riser) Bathroom Accessibility: Yes   Home  Equipment: Cane - single point;Walker - 2 wheels;Crutches;Toilet riser;Shower seat          Prior Functioning/Environment Level of Independence: Independent        Comments: ADLs, IADLs, driving, caring for his dog. enjoys hunting and being active outside        OT Problem List: Decreased range of motion;Decreased activity tolerance;Impaired balance (sitting and/or standing);Decreased knowledge of use of DME or AE;Decreased knowledge of precautions;Pain      OT Treatment/Interventions:      OT Goals(Current goals can be found in the care plan section) Acute Rehab OT Goals Patient Stated Goal: to go home OT Goal Formulation: With patient Time For Goal Achievement: 11/14/16 Potential to Achieve Goals: Good  OT Frequency:     Barriers to D/C:            Co-evaluation              AM-PAC PT "6 Clicks" Daily Activity     Outcome Measure Help from another person eating meals?: None Help from another person taking care of personal grooming?: None Help from another person toileting, which includes using toliet, bedpan, or urinal?: None Help from another person bathing (including washing, rinsing, drying)?: A Little Help from another person to put on and taking off regular upper body clothing?: None Help from another person to put on and taking off regular lower body clothing?: A Little 6 Click Score: 22   End of Session Equipment Utilized During Treatment: Gait belt Nurse Communication: Mobility status  Activity Tolerance: Patient tolerated treatment well Patient left: in chair;with call bell/phone within reach;with chair alarm set  OT Visit Diagnosis: Unsteadiness on feet (R26.81);Pain Pain - Right/Left:  (Back) Pain - part of body:  (Back)                Time: 6803-2122 OT Time Calculation (min): 27 min Charges:  OT General Charges $OT Visit: 1 Visit OT Evaluation $OT Eval Low Complexity: 1 Low OT Treatments $Self Care/Home Management : 8-22 mins G-Codes:      Vonore, OTR/L Acute Rehab Pager: [phone] Office: [place] 10/31/2016, 12:08 PM

## 2016-11-01 ENCOUNTER — Telehealth (INDEPENDENT_AMBULATORY_CARE_PROVIDER_SITE_OTHER): Payer: Self-pay | Admitting: Specialist

## 2016-11-01 MED ORDER — ASPIRIN 81 MG PO TBEC: 81.0000 mg | DELAYED_RELEASE_TABLET | Freq: Every day | ORAL | 3 refills | Status: AC

## 2016-11-01 MED ORDER — METHOCARBAMOL 500 MG PO TABS: 500.0000 mg | ORAL_TABLET | Freq: Three times a day (TID) | ORAL | 1 refills | Status: AC | PRN

## 2016-11-01 MED ORDER — DOCUSATE SODIUM 100 MG PO CAPS: 100.0000 mg | ORAL_CAPSULE | Freq: Two times a day (BID) | ORAL | 0 refills | Status: AC

## 2016-11-01 MED ORDER — HYDROCODONE-ACETAMINOPHEN 5-325 MG PO TABS: 1.0000 | ORAL_TABLET | ORAL | 0 refills | Status: DC | PRN

## 2016-11-01 MED ORDER — MELOXICAM 15 MG PO TABS: 15.0000 mg | ORAL_TABLET | Freq: Every day | ORAL | 3 refills | Status: DC

## 2016-11-01 NOTE — Care Management Note (Signed)
Case Management Note  Patient Details  Name: Ian Hendricks MRN: [id number] Date of Birth: [birth year]  Subjective/Objective:                    Action/Plan: Pt with orders for [facility] services. PT/OT with no f/u. CM met with the patient and he is refusing [facility] services. CM inquired about walker and 3 in 1 and he states he has this equipment at home.  Pt states his wife will provide transportation home.   Expected Discharge Date:  11/01/16               Expected Discharge Plan:  [place]  In-House Referral:     Discharge planning Services  CM Consult  Post Acute Care Choice:  Durable Medical Equipment, Home Health Choice offered to:  Patient  DME Arranged:    DME Agency:     HH Arranged:  Patient Refused [place] Agency:     Status of Service:  Completed, signed off  If discussed at [employer], dates discussed:    Additional Comments:  Pollie Friar, RN 11/01/2016, 9:23 AM

## 2016-11-01 NOTE — Progress Notes (Signed)
This RN went over Discharge With Pt. And Family. Pt. And Family verbalized Understanding. All question and concerns addressed. discharged home, taken down in a wheelchair.

## 2016-11-01 NOTE — Progress Notes (Signed)
     Subjective: 2 Days Post-Op Procedure(s) (LRB): BILATERAL PARTIAL HEMILAMINECTOMIES Lumbar three-four, , Lumbar four-five  and Lumbar five-Sacrum one (N/A) Awake, alert and oriented x 4. No nausea or emesis, tolerating pos And narcotics by mouth.  Patient reports pain as moderate.    Objective:   VITALS:  Temp:  [98.4 F (36.9 C)-99.2 F (37.3 C)] 98.6 F (37 C) (10/25 0425) Pulse Rate:  [55-65] 65 (10/25 0425) Resp:  [18-20] 20 (10/25 0425) BP: (97-120)/(47-64) 111/61 (10/25 0425) SpO2:  [92 %-96 %] 92 % (10/25 0425)  Neurologically intact ABD soft Neurovascular intact Sensation intact distally Intact pulses distally Dorsiflexion/Plantar flexion intact Incision: no drainage No cellulitis present   LABS  Recent Labs  10/31/16 0327  HGB 12.5*  WBC 12.6*  PLT 180    Recent Labs  10/31/16 0327  NA 139  K 3.5  CL 103  CO2 26  BUN 13  CREATININE 0.95  GLUCOSE 150*   No results for input(s): LABPT, INR in the last 72 hours.   Assessment/Plan: 2 Days Post-Op Procedure(s) (LRB): BILATERAL PARTIAL HEMILAMINECTOMIES Lumbar three-four, , Lumbar four-five  and Lumbar five-Sacrum one (N/A)  Advance diet Up with therapy Discharge home with home health  Ian Hendricks 11/01/2016, 8:37 AM Patient ID: Ian Hendricks, male   DOB: 27-Nov-1940, 76 y.o.   MRN: [id number]

## 2016-11-14 ENCOUNTER — Ambulatory Visit (INDEPENDENT_AMBULATORY_CARE_PROVIDER_SITE_OTHER): Payer: Medicare Other | Admitting: Surgery

## 2016-11-14 ENCOUNTER — Encounter (INDEPENDENT_AMBULATORY_CARE_PROVIDER_SITE_OTHER): Payer: Self-pay | Admitting: Surgery

## 2016-11-14 DIAGNOSIS — Z9889 Other specified postprocedural states: Secondary | ICD-10-CM

## 2016-11-14 MED ORDER — HYDROCODONE-ACETAMINOPHEN 5-325 MG PO TABS: 1.0000 | ORAL_TABLET | Freq: Three times a day (TID) | ORAL | 0 refills | Status: AC | PRN

## 2016-11-14 NOTE — Progress Notes (Signed)
   Post-Op Visit Note   Patient: Ian Hendricks           Date of Birth: 10/15/1940           MRN: [id number] Visit Date: 11/14/2016 PCP: System, Pcp Not In   Assessment & Plan:  Chief Complaint:  Chief Complaint  Patient presents with  . Lower Back - Pain   Patient returns. About 2 weeks status post multilevel decompression. States that he is doing very well. Preoperative leg symptoms greatly improved. Visit Diagnoses:  1. History of lumbar laminectomy for spinal cord decompression     Plan: Patient will avoid bending twisting heavy lifting. Given prescription for Norco 5/325 but he will not get this filled until he runs out of his other pills. Follow-up in 4 weeks for recheck.  Follow-Up Instructions: Return in about 4 weeks (around 12/12/2016) for dr Otelia Sergeantnitka.   Orders:  No orders of the defined types were placed in this encounter.  Meds ordered this encounter  Medications  . HYDROcodone-acetaminophen (NORCO/VICODIN) 5-325 MG tablet    Sig: Take 1 tablet every 8 (eight) hours as needed by mouth for moderate pain ((score 4 to 6)).    Dispense:  40 tablet    Refill:  0    Imaging: No results found.  PMFS History: Patient Active Problem List   Diagnosis Date Noted  . Lumbar stenosis with neurogenic claudication 10/30/2016    Class: Chronic  . Spinal stenosis of lumbar region with neurogenic claudication 10/30/2016   Past Medical History:  Diagnosis Date  . Arthritis   . Early cataracts, bilateral   . Headache    PMH  . History of kidney stones   . HOH (hard of hearing)   . Hypertension   . Lumbar stenosis   . Wears dentures     History reviewed. No pertinent family history.  Past Surgical History:  Procedure Laterality Date  . ABDOMINAL EXPOSURE     to remove a kidney stone  . COLONOSCOPY    . FRACTURE SURGERY     right ankle  . HERNIA REPAIR     B/L inguinal  . MULTIPLE TOOTH EXTRACTIONS     Social History   Occupational History  . Not on file    Tobacco Use  . Smoking status: Never Smoker  . Smokeless tobacco: Never Used  Substance and Sexual Activity  . Alcohol use: No  . Drug use: No  . Sexual activity: Not on file   Exam Very pleasant elderly white male alert and oriented in no acute distress. Ablating well. Surgical incision is healing well without drainage or signs of infection. Bilateral calves nontender. No focal motor deficits. Neurovascular intact.

## 2016-11-15 ENCOUNTER — Inpatient Hospital Stay (INDEPENDENT_AMBULATORY_CARE_PROVIDER_SITE_OTHER): Payer: Medicare Other | Admitting: Surgery

## 2016-11-22 NOTE — Discharge Summary (Signed)
Patient ID: Ian Hendricks MRN: [id number] DOB/AGE: 1940/10/26 76 y.o.  Admit date: 10/30/2016 Discharge date: 11/22/2016  Admission Diagnoses:  Active Problems:   Lumbar stenosis with neurogenic claudication   Spinal stenosis of lumbar region with neurogenic claudication   Discharge Diagnoses:  Active Problems:   Lumbar stenosis with neurogenic claudication   Spinal stenosis of lumbar region with neurogenic claudication  status post Procedure(s): BILATERAL PARTIAL HEMILAMINECTOMIES Lumbar three-four, , Lumbar four-five  and Lumbar five-Sacrum one  Past Medical History:  Diagnosis Date  . Arthritis   . Early cataracts, bilateral   . Headache    PMH  . History of kidney stones   . HOH (hard of hearing)   . Hypertension   . Lumbar stenosis   . Wears dentures     Surgeries: Procedure(s): BILATERAL PARTIAL HEMILAMINECTOMIES Lumbar three-four, , Lumbar four-five  and Lumbar five-Sacrum one on 10/30/2016   Consultants:   Discharged Condition: Improved  Hospital Course: Ian Hendricks is an 76 y.o. male who was admitted 10/30/2016 for operative treatment of lumbar stenosis. Patient failed conservative treatments (please see the history and physical for the specifics) and had severe unremitting pain that affects sleep, daily activities and work/hobbies. After pre-op clearance, the patient was taken to the operating room on 10/30/2016 and underwent  Procedure(s): BILATERAL PARTIAL HEMILAMINECTOMIES Lumbar three-four, , Lumbar four-five  and Lumbar five-Sacrum one.    Patient was given perioperative antibiotics:  Anti-infectives (From admission, onward)   Start     Dose/Rate Route Frequency Ordered Stop   10/30/16 1600  ceFAZolin (ANCEF) IVPB 2g/100 mL premix     2 g 200 mL/hr over 30 Minutes Intravenous Every 8 hours 10/30/16 1327 10/31/16 0101   10/30/16 0559  ceFAZolin (ANCEF) 2-4 GM/100ML-% IVPB    Comments:  Posey Pronto   : cabinet override      10/30/16  0559 10/30/16 0748   10/30/16 0557  ceFAZolin (ANCEF) IVPB 2g/100 mL premix     2 g 200 mL/hr over 30 Minutes Intravenous On call to O.R. 10/30/16 1191 10/30/16 0748       Patient was given sequential compression devices and early ambulation to prevent DVT.   Patient benefited maximally from hospital stay and there were no complications. At the time of discharge, the patient was urinating/moving their bowels without difficulty, tolerating a regular diet, pain is controlled with oral pain medications and they have been cleared by PT/OT.   Recent vital signs: No data found.   Recent laboratory studies: No results for input(s): WBC, HGB, HCT, PLT, NA, K, CL, CO2, BUN, CREATININE, GLUCOSE, INR, CALCIUM in the last 72 hours.  Invalid input(s): PT, 2   Discharge Medications:   Allergies as of 11/01/2016   No Known Allergies     Medication List    STOP taking these medications   ibuprofen 200 MG tablet Commonly known as:  ADVIL,MOTRIN     TAKE these medications   amLODipine 5 MG tablet Commonly known as:  NORVASC Take 5 mg by mouth daily.   aspirin EC 81 MG tablet Take 81 mg by mouth daily. What changed:  Another medication with the same name was added. Make sure you understand how and when to take each.   aspirin 81 MG EC tablet Take 1 tablet (81 mg total) by mouth daily. What changed:  You were already taking a medication with the same name, and this prescription was added. Make sure you understand how and when to take  each.   bisoprolol-hydrochlorothiazide 2.5-6.25 MG tablet Commonly known as:  ZIAC Take 1 tablet by mouth daily.   CALCIUM 600 PO Take 1 tablet by mouth daily.   COMPLETE PROSTATE HEALTH PO Take 1 tablet by mouth daily.   docusate sodium 100 MG capsule Commonly known as:  COLACE Take 1 capsule (100 mg total) by mouth 2 (two) times daily.   lisinopril 10 MG tablet Commonly known as:  PRINIVIL,ZESTRIL Take 10 mg by mouth daily.   meloxicam 15 MG  tablet Commonly known as:  MOBIC Take 15 mg by mouth daily. What changed:  Another medication with the same name was added. Make sure you understand how and when to take each.   meloxicam 15 MG tablet Commonly known as:  MOBIC Take 1 tablet (15 mg total) by mouth daily. What changed:  You were already taking a medication with the same name, and this prescription was added. Make sure you understand how and when to take each.   methocarbamol 500 MG tablet Commonly known as:  ROBAXIN Take 1 tablet (500 mg total) by mouth every 8 (eight) hours as needed for muscle spasms.   MULTI-VITAMIN PO Take 1 tablet by mouth daily.   vitamin C 500 MG tablet Commonly known as:  ASCORBIC ACID Take 500 mg by mouth daily.       Diagnostic Studies: Dg Chest 2 View  Result Date: 10/25/2016 CLINICAL DATA:  Preoperative evaluation for lumbar hemilaminectomies. Hypertension. EXAM: CHEST  2 VIEW COMPARISON:  None. FINDINGS: There is slight scarring in each lower lobe. No edema or consolidation. Heart size and pulmonary vascularity are normal. No adenopathy. There is aortic atherosclerosis. There is degenerative change in each shoulder. IMPRESSION: Slight lower lobe scarring bilaterally. No edema or consolidation. Heart size within normal limits. There is aortic atherosclerosis. Aortic Atherosclerosis (ICD10-I70.0). Electronically Signed   By: Bretta BangWilliam  Woodruff III M.D.   On: 10/25/2016 11:47   Dg Lumbar Spine 1 View  Result Date: 10/30/2016 CLINICAL DATA:  L3-4, L4-5, L5-S1 surgery EXAM: LUMBAR SPINE - 1 VIEW COMPARISON:  MRI 08/17/2016 FINDINGS: Posterior surgical instrument directed at the L5 pedicles. IMPRESSION: Intraoperative localization as above. Electronically Signed   By: Charlett NoseKevin  Dover M.D.   On: 10/30/2016 11:23    Discharge Instructions    Call MD / Call 911   Complete by:  As directed    If you experience chest pain or shortness of breath, CALL 911 and be transported to the hospital emergency  room.  If you develope a fever above 101 F, pus (white drainage) or increased drainage or redness at the wound, or calf pain, call your surgeon's office.   Constipation Prevention   Complete by:  As directed    Drink plenty of fluids.  Prune juice may be helpful.  You may use a stool softener, such as Colace (over the counter) 100 mg twice a day.  Use MiraLax (over the counter) for constipation as needed.   Diet - low sodium heart healthy   Complete by:  As directed    Discharge instructions   Complete by:  As directed    No lifting greater than 10 lbs. Avoid bending, stooping and twisting. Walk in house for first week them may start to get out slowly increasing distance up to one quarter mile by 3 weeks post op. Keep incision dry for 3 days, may use tegaderm or similar water impervious dressing.   Driving restrictions   Complete by:  As directed  No driving for 3 weeks   Increase activity slowly as tolerated   Complete by:  As directed    Lifting restrictions   Complete by:  As directed    No lifting for 8 weeks      Follow-up Information    Kerrin ChampagneNitka, Dorreen Valiente E, MD In 2 weeks.   Specialty:  Orthopedic Surgery Why:  For wound re-check Contact information: [address] [place] [address] [phone]           Discharge Plan:  discharge to home  Disposition:     Signed: Zonia KiefJames Damon Hargrove  11/22/2016, 2:49 PM

## 2016-12-20 ENCOUNTER — Encounter (INDEPENDENT_AMBULATORY_CARE_PROVIDER_SITE_OTHER): Payer: Self-pay | Admitting: Specialist

## 2016-12-20 ENCOUNTER — Ambulatory Visit (INDEPENDENT_AMBULATORY_CARE_PROVIDER_SITE_OTHER): Payer: Medicare Other | Admitting: Specialist

## 2016-12-20 VITALS — BP 125/62 | HR 58 | Ht 69.0 in | Wt 180.0 lb

## 2016-12-20 DIAGNOSIS — Z9889 Other specified postprocedural states: Secondary | ICD-10-CM

## 2016-12-20 DIAGNOSIS — M4807 Spinal stenosis, lumbosacral region: Secondary | ICD-10-CM

## 2016-12-20 NOTE — Patient Instructions (Signed)
Avoid frequent bending and stooping  No lifting greater than 20-25 lbs. May use ice or moist heat for pain. Weight loss is of benefit. Handicap license is approved.

## 2016-12-20 NOTE — Progress Notes (Deleted)
   Office Visit Note   Patient: Ian Hendricks           Date of Birth: 1940/12/18           MRN: [id number] Visit Date: 12/20/2016              Requested by: No referring provider defined for this encounter. PCP: System, Pcp Not In   Assessment & Plan: Visit Diagnoses: No diagnosis found.  Plan: ***  Follow-Up Instructions: No Follow-up on file.   Orders:  No orders of the defined types were placed in this encounter.  No orders of the defined types were placed in this encounter.     Procedures: No procedures performed   Clinical Data: No additional findings.   Subjective: Chief Complaint  Patient presents with  . Lower Back - Routine Post Op    HPI  Review of Systems   Objective: Vital Signs: BP 125/62 (BP Location: Left Arm, Patient Position: Sitting)   Pulse (!) 58   Ht 5\' 9"  (1.753 m)   Wt 180 lb (81.6 kg)   BMI 26.58 kg/m   Physical Exam  Ortho Exam  Specialty Comments:  No specialty comments available.  Imaging: No results found.   PMFS History: Patient Active Problem List   Diagnosis Date Noted  . Lumbar stenosis with neurogenic claudication 10/30/2016    Priority: High    Class: Chronic  . Spinal stenosis of lumbar region with neurogenic claudication 10/30/2016   Past Medical History:  Diagnosis Date  . Arthritis   . Early cataracts, bilateral   . Headache    PMH  . History of kidney stones   . HOH (hard of hearing)   . Hypertension   . Lumbar stenosis   . Wears dentures     No family history on file.  Past Surgical History:  Procedure Laterality Date  . ABDOMINAL EXPOSURE     to remove a kidney stone  . COLONOSCOPY    . FRACTURE SURGERY     right ankle  . HERNIA REPAIR     B/L inguinal  . LUMBAR LAMINECTOMY N/A 10/30/2016   Procedure: BILATERAL PARTIAL HEMILAMINECTOMIES Lumbar three-four, , Lumbar four-five  and Lumbar five-Sacrum one;  Surgeon: Kerrin ChampagneNitka, Jericho Cieslik E, MD;  Location: [facility] OR;  Service: Orthopedics;   Laterality: N/A;  . MULTIPLE TOOTH EXTRACTIONS     Social History   Occupational History  . Not on file  Tobacco Use  . Smoking status: Never Smoker  . Smokeless tobacco: Never Used  Substance and Sexual Activity  . Alcohol use: No  . Drug use: No  . Sexual activity: Not on file

## 2016-12-20 NOTE — Progress Notes (Signed)
   Office Visit Note   Patient: Ian HalterDouglas E Thielman           Date of Birth: 1940-09-19           MRN: [id number] Visit Date: 12/20/2016              Requested by: No referring provider defined for this encounter. PCP: System, Pcp Not In   Assessment & Plan: Visit Diagnoses:  1. Status post lumbar laminectomy   2. Spinal stenosis of lumbosacral region     Plan: Avoid frequent bending and stooping  No lifting greater than 20-25 lbs. May use ice or moist heat for pain. Weight loss is of benefit. Handicap license is approved.  Follow-Up Instructions: Return in about 5 weeks (around 01/24/2017).   Orders:  No orders of the defined types were placed in this encounter.  No orders of the defined types were placed in this encounter.     Procedures: No procedures performed   Clinical Data: No additional findings.   Subjective: Chief Complaint  Patient presents with  . Lower Back - Routine Post Op    76 year old male post multiple level bilateral partial hemilaminectomies L3-4, L4-5 and L5-S1. Now 2 1/2 months post surgery. No bowel or bladder difficulties. No leg numbness or paresthesias. Walking up to 8 10ths of a mile per day. Gradually building his strength and endurance.     Review of Systems   Objective: Vital Signs: BP 125/62 (BP Location: Left Arm, Patient Position: Sitting)   Pulse (!) 58   Ht 5\' 9"  (1.753 m)   Wt 180 lb (81.6 kg)   BMI 26.58 kg/m   Physical Exam  Ortho Exam  Specialty Comments:  No specialty comments available.  Imaging: No results found.   PMFS History: Patient Active Problem List   Diagnosis Date Noted  . Lumbar stenosis with neurogenic claudication 10/30/2016    Priority: High    Class: Chronic  . Spinal stenosis of lumbar region with neurogenic claudication 10/30/2016   Past Medical History:  Diagnosis Date  . Arthritis   . Early cataracts, bilateral   . Headache    PMH  . History of kidney stones   . HOH (hard  of hearing)   . Hypertension   . Lumbar stenosis   . Wears dentures     No family history on file.  Past Surgical History:  Procedure Laterality Date  . ABDOMINAL EXPOSURE     to remove a kidney stone  . COLONOSCOPY    . FRACTURE SURGERY     right ankle  . HERNIA REPAIR     B/L inguinal  . LUMBAR LAMINECTOMY N/A 10/30/2016   Procedure: BILATERAL PARTIAL HEMILAMINECTOMIES Lumbar three-four, , Lumbar four-five  and Lumbar five-Sacrum one;  Surgeon: Kerrin ChampagneNitka, Andersson Larrabee E, MD;  Location: [facility] OR;  Service: Orthopedics;  Laterality: N/A;  . MULTIPLE TOOTH EXTRACTIONS     Social History   Occupational History  . Not on file  Tobacco Use  . Smoking status: Never Smoker  . Smokeless tobacco: Never Used  Substance and Sexual Activity  . Alcohol use: No  . Drug use: No  . Sexual activity: Not on file

## 2017-01-30 ENCOUNTER — Ambulatory Visit (INDEPENDENT_AMBULATORY_CARE_PROVIDER_SITE_OTHER): Payer: Medicare Other | Admitting: Specialist

## 2017-01-30 ENCOUNTER — Encounter (INDEPENDENT_AMBULATORY_CARE_PROVIDER_SITE_OTHER): Payer: Self-pay | Admitting: Specialist

## 2017-01-30 VITALS — BP 143/75 | HR 55 | Ht 69.0 in | Wt 180.0 lb

## 2017-01-30 DIAGNOSIS — Z9889 Other specified postprocedural states: Secondary | ICD-10-CM

## 2017-01-30 MED ORDER — MELOXICAM 15 MG PO TABS: 15.0000 mg | ORAL_TABLET | Freq: Every day | ORAL | 4 refills | Status: AC

## 2017-01-30 NOTE — Progress Notes (Signed)
   Post-Op Visit Note   Patient: Ian HalterDouglas E Ducharme           Date of Birth: [birth year]           MRN: [id number] Visit Date: 01/30/2017 PCP: System, Pcp Not In   Assessment & Plan:3 months following bilateral partial hemilaminectomies L3-4, L4-5 and L5-S1 for spinal stenosis. Chief Complaint:  Chief Complaint  Patient presents with  . Lower Back - Follow-up  His leg pain is much better, standing and walking tolerance is improving.  No narcotic pain medications. Meloxicam daily for arthritis pain.   Motor intact minimal weakness left foot DF. SLR negative Visit Diagnoses: No diagnosis found.  Plan: Avoid frequent bending and stooping  No lifting greater than 10 lbs. May use ice or moist heat for pain. Weight loss is of benefit. Handicap license is approved. Meloxicam for arthritis pain may take one a day as needed. Should be followed closely if you need to take the meloxicam continuously. Return as needed. Follow-Up Instructions: Return if symptoms worsen or fail to improve.   Orders:  No orders of the defined types were placed in this encounter.  No orders of the defined types were placed in this encounter.   Imaging: No results found.  PMFS History: Patient Active Problem List   Diagnosis Date Noted  . Lumbar stenosis with neurogenic claudication 10/30/2016    Priority: High    Class: Chronic  . Spinal stenosis of lumbar region with neurogenic claudication 10/30/2016   Past Medical History:  Diagnosis Date  . Arthritis   . Early cataracts, bilateral   . Headache    PMH  . History of kidney stones   . HOH (hard of hearing)   . Hypertension   . Lumbar stenosis   . Wears dentures     History reviewed. No pertinent family history.  Past Surgical History:  Procedure Laterality Date  . ABDOMINAL EXPOSURE     to remove a kidney stone  . COLONOSCOPY    . FRACTURE SURGERY     right ankle  . HERNIA REPAIR     B/L inguinal  . LUMBAR LAMINECTOMY N/A  10/30/2016   Procedure: BILATERAL PARTIAL HEMILAMINECTOMIES Lumbar three-four, , Lumbar four-five  and Lumbar five-Sacrum one;  Surgeon: Kerrin ChampagneNitka, Cace Osorto E, MD;  Location: [facility] OR;  Service: Orthopedics;  Laterality: N/A;  . MULTIPLE TOOTH EXTRACTIONS     Social History   Occupational History  . Not on file  Tobacco Use  . Smoking status: Never Smoker  . Smokeless tobacco: Never Used  Substance and Sexual Activity  . Alcohol use: No  . Drug use: No  . Sexual activity: Not on file

## 2017-01-30 NOTE — Patient Instructions (Addendum)
Avoid frequent bending and stooping  No lifting greater than 10 lbs. May use ice or moist heat for pain. Weight loss is of benefit. Handicap license is approved.  Meloxicam for arthritis pain may take one a day as needed. Should be followed closely if you need to take the meloxicam continuously. Return as needed.

## 2017-11-12 IMAGING — CR DG LUMBAR SPINE 1V
1 series · 1 of 1 positions shown · non-contrast
Comparison: MRI 08/17/2016

CLINICAL DATA: L3-4, L4-5, L5-S1 surgery

EXAM:
LUMBAR SPINE - 1 VIEW

[lateral]
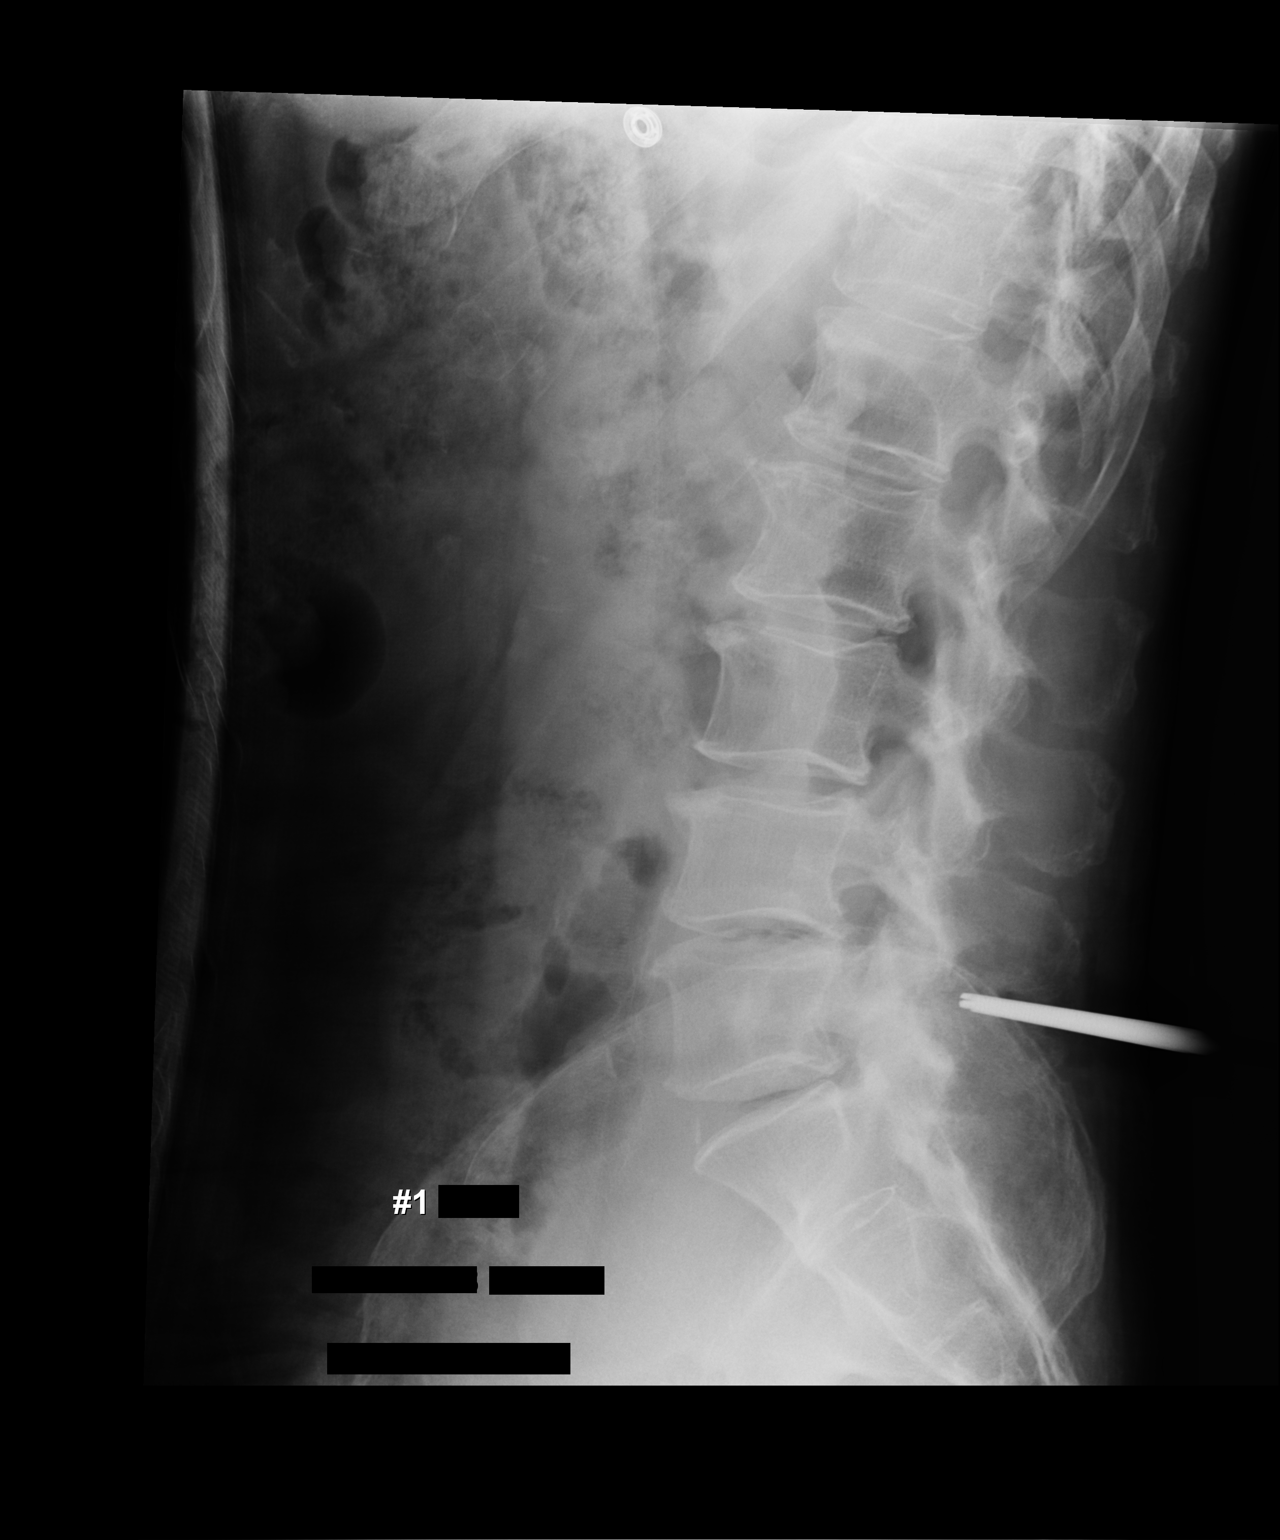

[1 of 1 positions shown; findings below may reference images not displayed]

FINDINGS: Posterior surgical instrument directed at the L5 pedicles.
IMPRESSION: Intraoperative localization as above.
